# Patient Record
Sex: Female | Born: 1990 | Race: Black or African American | Hispanic: No | Marital: Single | Smoking: Never smoker
Health system: Southern US, Community
[De-identification: ages and names within clinical notes are randomized; demographics above are authoritative.]

## PROBLEM LIST (undated history)

## (undated) DIAGNOSIS — B9689 Other specified bacterial agents as the cause of diseases classified elsewhere: Secondary | ICD-10-CM

## (undated) DIAGNOSIS — N760 Acute vaginitis: Principal | ICD-10-CM

## (undated) DIAGNOSIS — B369 Superficial mycosis, unspecified: Secondary | ICD-10-CM

## (undated) HISTORY — PX: COLPOSCOPY VULVA W/ BIOPSY: SUR282

## (undated) HISTORY — PX: COLPOSCOPY: SHX161

---
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
## 2012-07-02 ENCOUNTER — Ambulatory Visit: Payer: Self-pay | Admitting: Family Medicine

## 2012-08-02 ENCOUNTER — Ambulatory Visit: Payer: Self-pay | Admitting: Internal Medicine

## 2013-03-20 ENCOUNTER — Ambulatory Visit: Payer: Self-pay

## 2013-03-20 LAB — URINALYSIS, COMPLETE
Bilirubin,UR: NEGATIVE
Blood: NEGATIVE
Glucose,UR: NEGATIVE mg/dL (ref 0–75)
Nitrite: NEGATIVE
Ph: 6 (ref 4.5–8.0)
Protein: NEGATIVE

## 2013-03-20 LAB — GC/CHLAMYDIA PROBE AMP

## 2014-11-18 ENCOUNTER — Ambulatory Visit
Admission: EM | Admit: 2014-11-18 | Discharge: 2014-11-18 | Disposition: A | Discharge status: Home / Self Care | Payer: 59 | Attending: Family Medicine | Admitting: Family Medicine

## 2014-11-18 DIAGNOSIS — L0100 Impetigo, unspecified: Secondary | ICD-10-CM | POA: Diagnosis not present

## 2014-11-18 MED ORDER — MUPIROCIN CALCIUM 2 % EX CREA: 1.0000 "application " | TOPICAL_CREAM | Freq: Two times a day (BID) | CUTANEOUS | Status: DC

## 2014-11-18 NOTE — Discharge Instructions (Signed)
MRSA Infection MRSA stands for methicillin-resistant Staphylococcus aureus. This type of infection is caused by Staphylococcus aureus bacteria that are no longer affected by the medicines used to kill them (drug resistant). Staphylococcus (staph) bacteria are normally found on the skin or in the nose of healthy people. In most cases, these bacteria do not cause infection. But if these resistant bacteria enter your body through a cut or sore, they can cause a serious infection on your skin or in other parts of your body. There is a slight chance that the staph on your skin or in your nose is MRSA. There are two types of MRSA infections:  Hospital-acquired MRSA is bacteria that you get in the hospital.  Community-acquired MRSA is bacteria that you get somewhere other than in a hospital. RISK FACTORS Hospital-acquired MRSA is more common. You could be at risk for this infection if you are in the hospital and you:  Have surgery or a procedure.  Have an IV access or a catheter tube placed in your body.  Have weak resistance to germs (weakened immune system).  Are elderly.  Are on kidney dialysis. You could be at risk for community-acquired MRSA if you have a break in your skin and come into contact with MRSA. This may happen if you:  Play sports where there is skin-to-skin contact.  Live in a crowded setting, like a dormitory or a military barracks.  Share towels, razors, or sports equipment with other people. SYMPTOMS  Symptoms of hospital-acquired MRSA depend on where MRSA has spread. Symptoms may include:  Wound infection.  Skin infection.  Rash.  Pneumonia.  Fever and chills.  Difficulty breathing.  Chest pain. Community-acquired MRSA is most likely to start as a scratch or cut that becomes infected. Symptoms may include:  A pus-filled pimple.  A boil on your skin.  Pus draining from your skin.  A sore (abscess) under your skin or somewhere in your body.  Fever  with or without chills. DIAGNOSIS  The diagnosis of MRSA is made by taking a sample from an infected area and sending it to a lab for testing. A lab technician can grow (culture) MRSA and check it under a microscope. The cultured MRSA can be tested to see which type of antibiotic medicine will work to treat it. Newer tests can identify MRSA more quickly by testing bacteria samples for MRSA genes. Your health care provider can diagnose MRSA using samples from:   Cuts or wounds in infected areas.  Nasal swabs.  Saliva or cough specimens from deep in the lungs (sputum).  Urine.  Blood. You may also have:  Imaging studies (such as X-ray or MRI) to check if the infection has spread to the lungs, bones, or joints.  A culture and sensitivity test of blood or fluids from inside the joints. TREATMENT  Treatment depends on how severe, deep, or extensive the infection is. Very bad infections may require a hospital stay.  Some skin infections, such as a small boil or sore (abscess), may be treated by draining pus from the site of the infection.  More extensive surgery to drain pus may be necessary for deeper or more widespread soft tissue infections.  You may then have to take antibiotic medicine given by mouth or through a vein. You may start antibiotic treatment right away or after testing can be done to see what antibiotic medicine should be used. HOME CARE INSTRUCTIONS   Take your antibiotics as directed by your health care provider. Take   the medicine as prescribed until it is finished.  Avoid close contact with those around you as much as possible. Do not use towels, razors, toothbrushes, bedding, or other items that will be used by others.  Wash your hands frequently for 15 seconds with soap and water. Dry your hands with a clean or disposable towel.  When you are not able to wash your hands, use hand sanitizer that is more than 60 percent alcohol.  Wash towels, sheets, or clothes in  the washing machine with detergent and hot water. Dry them in a hot dryer.  Follow your health care provider's instructions for wound care. Wash your hands before and after changing your bandages.  Always shower after exercising.  Keep all cuts and scrapes clean and covered with a bandage.  Be sure to tell all your health care providers that you have MRSA so they are aware of your infection. SEEK MEDICAL CARE IF:  You have a cut, scrape, pimple, or boil that becomes red, swollen, or painful or has pus in it.  You have pus draining from your skin.  You have an abscess under your skin or somewhere in your body. SEEK IMMEDIATE MEDICAL CARE IF:   You have symptoms of a skin infection with a fever or chills.  You have trouble breathing.  You have chest pain.  You have a skin wound and you become nauseous or start vomiting. MAKE SURE YOU:  Understand these instructions.  Will watch your condition.  Will get help right away if you are not doing well or get worse. Document Released: 06/24/2005 Document Revised: 06/29/2013 Document Reviewed: 04/16/2013 ExitCare Patient Information 2015 [employer]. This information is not intended to replace advice given to you by your health care provider. Make sure you discuss any questions you have with your health care provider.  

## 2014-11-18 NOTE — ED Provider Notes (Signed)
CSN: [id number]     Arrival date & time 11/18/14  1608 History   First MD Initiated Contact with Patient 11/18/14 1801     Chief Complaint  Patient presents with  . Rash   (Consider location/radiation/quality/duration/timing/severity/associated sxs/prior Treatment) HPI Comments: African [place] female with reoccurrence rash left shoulder and right lateral ankle.  Put on OTC antifungal cream when she noticed yesterday some purulent discharge/itching.  Daughter has sample of rash on scalp taken by PCM earlier this week results still pending.  Mother thought she had ring worm  PCM told her possible lice and to treat daughter.  Mother denied seeing nits or combing any out but child has dandruff.  Patient is a 24 y.o. female presenting with rash. The history is provided by the patient.  Rash Location:  Leg and shoulder/arm Shoulder/arm rash location:  L shoulder Leg rash location:  R ankle Severity:  Mild Onset quality:  Sudden Duration:  2 days Timing:  Constant Progression:  Worsening Chronicity:  Recurrent Context: exposure to similar rash   Context: not animal contact, not chemical exposure, not diapers, not eggs, not food, not hot tub use, not insect bite/sting, not medications, not new detergent/soap, not nuts, not plant contact, not pollen, not pregnancy, not sick contacts and not sun exposure   Relieved by:  Anti-fungal cream Worsened by:  Nothing tried Associated symptoms: no abdominal pain, no diarrhea, no fatigue, no fever, no headaches, no hoarse voice, no induration, no joint pain, no myalgias, no nausea, no periorbital edema, no shortness of breath, no sore throat, no throat swelling, no tongue swelling, no URI, not vomiting and not wheezing     History reviewed. No pertinent past medical history. History reviewed. No pertinent past surgical history. History reviewed. No pertinent family history. History  Substance Use Topics  . Smoking status: Never Smoker   . Smokeless  tobacco: Not on file  . Alcohol Use: No   OB History    No data available     Review of Systems  Constitutional: Negative for fever, chills, diaphoresis, activity change, appetite change and fatigue.  HENT: Negative for congestion, hoarse voice, mouth sores, rhinorrhea and sore throat.   Eyes: Negative for photophobia, pain, discharge, redness, itching and visual disturbance.  Respiratory: Negative for cough, shortness of breath and wheezing.   Cardiovascular: Negative for chest pain, palpitations and leg swelling.  Gastrointestinal: Negative for nausea, vomiting, abdominal pain, diarrhea and constipation.  Genitourinary: Negative for hematuria.  Musculoskeletal: Negative for myalgias, back pain, joint swelling, arthralgias, gait problem, neck pain and neck stiffness.  Skin: Positive for rash. Negative for color change, pallor and wound.  Neurological: Negative for dizziness, weakness and headaches.  Hematological: Negative for adenopathy. Does not bruise/bleed easily.  Psychiatric/Behavioral: Negative for behavioral problems, confusion, sleep disturbance and agitation.    Allergies  Review of patient's allergies indicates no known allergies.  Home Medications   Prior to Admission medications   Medication Sig Start Date End Date Taking? Authorizing Provider  mupirocin cream (BACTROBAN) 2 % Apply 1 application topically 2 (two) times daily. 11/18/14   Jarold Songina A Betancourt, NP   BP 115/71 mmHg  Pulse 86  Temp(Src) 97.5 F (36.4 C) (Tympanic)  Resp 16  Ht 5' (1.524 m)  Wt 93 lb (42.185 kg)  BMI 18.16 kg/m2  SpO2 100%  LMP 11/11/2014 (Approximate) Physical Exam  Constitutional: She is oriented to person, place, and time. Vital signs are normal. She appears well-developed and well-nourished. No distress.  HENT:  Head: Normocephalic and atraumatic.  Eyes: Conjunctivae, EOM and lids are normal. Pupils are equal, round, and reactive to light. Right eye exhibits no discharge. Left eye  exhibits no discharge. No scleral icterus.  Neck: Trachea normal and normal range of motion. Neck supple. No JVD present. No tracheal deviation present. No thyromegaly present.  Cardiovascular: Normal rate, regular rhythm, normal heart sounds and intact distal pulses.   Pulmonary/Chest: Effort normal and breath sounds normal. No respiratory distress. She has no wheezes. She has no rales. She exhibits no tenderness.  Musculoskeletal: Normal range of motion. She exhibits no edema or tenderness.  Lymphadenopathy:    She has no cervical adenopathy.  Neurological: She is alert and oriented to person, place, and time.  Skin: Skin is warm, dry and intact. Rash noted. No abrasion, no bruising, no burn, no ecchymosis, no laceration, no lesion, no petechiae and no purpura noted. Rash is maculopapular and pustular. Rash is not macular, not papular, not nodular and not vesicular. She is not diaphoretic. There is erythema. No pallor.     Patient had both lesions covered with bandaid and removed for evaluation  Psychiatric: She has a normal mood and affect. Her speech is normal and behavior is normal. Judgment and thought content normal. Cognition and memory are normal.  Nursing note and vitals reviewed.   ED Course  Procedures (including critical care time) Labs Review Labs Reviewed - No data to display  Imaging Review No results found. bactroban topical apply to affected areas twice a day for 10 days.  Do not scratch or itch.  Call or return to clinic as needed if these symptoms worsen e.g. Spreading, fever, chills or fail to improve as anticipated.  Exitcare handout on staphyloccocus infection given to patient.  Patient verbalized agreement and understanding of treatment plan.    MDM   1. Impetigo        Barbaraann Barthelina A Betancourt, NP 11/18/14 2113

## 2014-11-18 NOTE — ED Notes (Signed)
Patient complains of bump on left shoulder and one bump on left ankle. 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 Patient states that her symptoms started 2 days ago.

## 2015-04-30 ENCOUNTER — Ambulatory Visit
Admission: EM | Admit: 2015-04-30 | Discharge: 2015-04-30 | Disposition: A | Discharge status: Home / Self Care | Payer: 59 | Attending: Family Medicine | Admitting: Family Medicine

## 2015-04-30 ENCOUNTER — Encounter: Payer: Self-pay | Admitting: *Deleted

## 2015-04-30 DIAGNOSIS — N760 Acute vaginitis: Secondary | ICD-10-CM | POA: Diagnosis not present

## 2015-04-30 HISTORY — DX: Superficial mycosis, unspecified: B36.9

## 2015-04-30 LAB — CHLAMYDIA/NGC RT PCR (ARMC ONLY)
CHLAMYDIA TR: NOT DETECTED
N GONORRHOEAE: NOT DETECTED

## 2015-04-30 LAB — WET PREP, GENITAL
Trich, Wet Prep: NONE SEEN
YEAST WET PREP: NONE SEEN

## 2015-04-30 MED ORDER — METRONIDAZOLE 500 MG PO TABS: 500.0000 mg | ORAL_TABLET | Freq: Two times a day (BID) | ORAL | Status: DC

## 2015-04-30 NOTE — ED Provider Notes (Signed)
Patient presents today with symptoms of vaginal discharge. Patient states that she went to [facility] a few days ago and was treated with a gel for 5 days. She did not have resolution of her symptoms so she tried Monistat over-the-counter. Patient still has symptoms of vaginal discharge. She denies any pelvic pain, vaginal bleeding, dysuria, genital lesions, nausea, vomiting, fever. She admits to having history of STDs in the past that have been treated. Her last menstrual period was 04/12/15.   ROS: Negative except mentioned above. Vitals as per Epic.  GENERAL: NAD HEENT: no pharyngeal erythema, no exudate RESP: CTA B CARD: RRR ABD: +BS, NT GU: no genital lesions, mild white and yellow discharge, no CMT NEURO: CN II-XII grossly intact   A/P: Vaginitis-wet prep shows white blood cells, clue cells, will treat with Flagyl by mouth and wait for GC/Chlamydia results. Encourage patient on safe sex practices. If she continues to have any further symptoms are of advised her to follow-up with GYN.  Tina ProvostKirtida Maiyah Goyne, MD 04/30/15 [phone]

## 2015-04-30 NOTE — ED Notes (Signed)
Patient went to [facility] on 05/25/15 and was diagnosed with vaginitis. Patient was prescribed an antibiotic jell 5 day course that is inserted into the vagina. She is still having issues with the infection and has even tried monostat with no resolution. Patient is still having vaginal discharge.

## 2015-09-18 ENCOUNTER — Ambulatory Visit
Admission: EM | Admit: 2015-09-18 | Discharge: 2015-09-18 | Disposition: A | Discharge status: Home / Self Care | Payer: 59 | Attending: Family Medicine | Admitting: Family Medicine

## 2015-09-18 DIAGNOSIS — N760 Acute vaginitis: Secondary | ICD-10-CM | POA: Diagnosis not present

## 2015-09-18 DIAGNOSIS — A499 Bacterial infection, unspecified: Secondary | ICD-10-CM | POA: Diagnosis not present

## 2015-09-18 DIAGNOSIS — B9689 Other specified bacterial agents as the cause of diseases classified elsewhere: Secondary | ICD-10-CM

## 2015-09-18 HISTORY — DX: Acute vaginitis: N76.0

## 2015-09-18 HISTORY — DX: Other specified bacterial agents as the cause of diseases classified elsewhere: B96.89

## 2015-09-18 LAB — URINALYSIS COMPLETE WITH MICROSCOPIC (ARMC ONLY)
GLUCOSE, UA: NEGATIVE mg/dL
HGB URINE DIPSTICK: NEGATIVE
LEUKOCYTES UA: NEGATIVE
Nitrite: NEGATIVE
Protein, ur: NEGATIVE mg/dL
Specific Gravity, Urine: >1.03 — ABNORMAL HIGH (ref 1.005–1.030)
pH: 5.5 (ref 5.0–8.0)

## 2015-09-18 LAB — PREGNANCY, URINE: PREG TEST UR: NEGATIVE

## 2015-09-18 LAB — CHLAMYDIA/NGC RT PCR (ARMC ONLY)
CHLAMYDIA TR: NOT DETECTED
N GONORRHOEAE: NOT DETECTED

## 2015-09-18 LAB — WET PREP, GENITAL
Sperm: NONE SEEN
TRICH WET PREP: NONE SEEN
YEAST WET PREP: NONE SEEN

## 2015-09-18 MED ORDER — METRONIDAZOLE 500 MG PO TABS: 500.0000 mg | ORAL_TABLET | Freq: Two times a day (BID) | ORAL | Status: DC

## 2015-09-18 NOTE — ED Notes (Signed)
C/o itching and foul smelling vaginal discharge x 2 weeks. Hx of BV

## 2015-09-18 NOTE — ED Provider Notes (Signed)
[facility]  ____________________________________________  Time seen: Approximately 12:12 PM  I have reviewed the triage vital signs and the nursing notes.   HISTORY  Chief Complaint Vaginal Discharge   HPI Tina Fox is a 25 y.o. female presents with a complaint of 2 weeks of vaginal discharge. Patient reports that vaginal discharge does have an odor. States the vaginal discharge is a whitish color.  Patient does recent change in sexual partners in the last month. Denies any other recent sexual partner changes. Denies concern for sexually transmitted diseases.  Denies abnormal menstrual was, vaginal bleeding, vaginal pain, abdominal pain, dysuria, back pain, fevers, chest pain, shortness breath, dizziness or other complaints. Reports history of similar with bacterial vaginosis.  PCP: [facility]  Patient's last menstrual period was 09/10/2015 (exact date). Denies concern for pregnancy.    Past Medical History  Diagnosis Date  . Fungal dermatitis   . Bacterial vaginosis     There are no active problems to display for this patient.   Past Surgical History  Procedure Laterality Date  . Colposcopy vulva w/ biopsy      Current Outpatient Rx  Name  Route  Sig  Dispense  Refill  . metroNIDAZOLE (FLAGYL) 500 MG tablet   Oral   Take 1 tablet (500 mg total) by mouth 2 (two) times daily. For 7 days   14 tablet   0   . metroNIDAZOLE (METROGEL) 0.75 % vaginal gel   Vaginal   Place 1 Applicatorful vaginally 2 (two) times daily.         . mupirocin cream (BACTROBAN) 2 %   Topical   Apply 1 application topically 2 (two) times daily.   15 g   0     Allergies Terbinafine and related  History reviewed. No pertinent family history.  Social History Social History  Substance Use Topics  . Smoking status: Never Smoker   . Smokeless tobacco: Never Used  . Alcohol Use: 0.0 oz/week    0 Standard drinks or equivalent per week     Comment: socially     Review of Systems Constitutional: No fever/chills Eyes: No visual changes. ENT: No sore throat. Cardiovascular: Denies chest pain. Respiratory: Denies shortness of breath. Gastrointestinal: No abdominal pain.  No nausea, no vomiting.  No diarrhea.  No constipation. Genitourinary: Negative for dysuria.Positive vaginal discharge. Musculoskeletal: Negative for back pain. Skin: Negative for rash. Neurological: Negative for headaches, focal weakness or numbness.  10-point ROS otherwise negative.  ____________________________________________   PHYSICAL EXAM:  VITAL SIGNS: ED Triage Vitals  Enc Vitals Group     BP 09/18/15 1133 134/67 mmHg     Pulse Rate 09/18/15 1133 96     Resp 09/18/15 1133 16     Temp 09/18/15 1133 97.3 F (36.3 C)     Temp Source 09/18/15 1133 Tympanic     SpO2 09/18/15 1133 100 %     Weight 09/18/15 1133 95 lb (43.092 kg)     Height 09/18/15 1133 5' (1.524 m)     Head Cir --      Peak Flow --      Pain Score --      Pain Loc --      Pain Edu? --      Excl. in GC? --     Constitutional: Alert and oriented. Well appearing and in no acute distress. Eyes: Conjunctivae are normal. PERRL. EOMI. Head: Atraumatic.  Nose: No congestion/rhinnorhea.  Mouth/Throat: Mucous membranes are moist.  Oropharynx  non-erythematous.No exudate or tonsillar swelling. Neck: No stridor.  No cervical spine tenderness to palpation. Hematological/Lymphatic/Immunilogical: No cervical lymphadenopathy. Cardiovascular: Normal rate, regular rhythm. Grossly normal heart sounds.  Good peripheral circulation. Respiratory: Normal respiratory effort.  No retractions. Lungs CTAB. Gastrointestinal: Soft and nontender.  Normal Bowel sounds.  No CVA tenderness. Pelvic; completed with Beryle QuantLynne Anne RN at bedside as chaperone External: No rash or lesions, normal appearance. Speculum: Moderate whitish discharge. Cervical eyes closed. No vaginal bleeding or blood in vaginal vault. Bimanual:  Nontender. No cervical or adnexal tenderness. Musculoskeletal: No lower or upper extremity tenderness nor edema.  No cervical, thoracic or lumbar tenderness to palpation. Neurologic:  Normal speech and language. No gross focal neurologic deficits are appreciated. No gait instability. Skin:  Skin is warm, dry and intact. No rash noted. Psychiatric: Mood and affect are normal. Speech and behavior are normal.  ____________________________________________   LABS (all labs ordered are listed, but only abnormal results are displayed)  Labs Reviewed  WET PREP, GENITAL - Abnormal; Notable for the following:    Clue Cells Wet Prep HPF POC PRESENT (*)    WBC, Wet Prep HPF POC MODERATE (*)    All other components within normal limits  URINALYSIS COMPLETEWITH MICROSCOPIC ([facility] ONLY) - Abnormal; Notable for the following:    APPearance HAZY (*)    Bilirubin Urine 1+ (*)    Ketones, ur 1+ (*)    Specific Gravity, Urine >1.030 (*)    Bacteria, UA FEW (*)    Squamous Epithelial / LPF 0-5 (*)    All other components within normal limits  URINE CULTURE  CHLAMYDIA/NGC RT PCR ([facility] ONLY)  PREGNANCY, URINE    INITIAL IMPRESSION / ASSESSMENT AND PLAN / ED COURSE  Pertinent labs & imaging results that were available during my care of the patient were reviewed by me and considered in my medical decision making (see chart for details).  Very well-appearing patient. No acute distress. Presents for the complaint of vaginal discharge 2 weeks. Denies pain or other complaints. Pelvic exam completed. Urinalysis positive for few bacteria, 0-5 epithelial cells, 0-5 WBCs, hazy appearance, 1+ ketones, 1+ bilirubin, will culture urine. Wet prep positive for clue cells and moderate WBCs. Concern for a contaminated urinalysis, denies dysuria, will culture urinalysis and await culture report prior to initiating antibiotics. Treat bacterial vaginosis with oral Flagyl. Encourage PCP follow up. Encouraged pelvic  rest.   Discussed follow up with Primary care physician this week. Discussed follow up and return parameters including no resolution or any worsening concerns. Patient verbalized understanding and agreed to plan.   ____________________________________________   FINAL CLINICAL IMPRESSION(S) / ED DIAGNOSES  Final diagnoses:  Bacterial vaginosis      Note: This dictation was prepared with Dragon dictation along with smaller phrase technology. Any transcriptional errors that result from this process are unintentional.    Renford DillsLindsey Floyed Masoud, NP 09/18/15 1221

## 2015-09-18 NOTE — Discharge Instructions (Signed)
Take medication as prescribed.  Drink plenty of fluids. No sexual activity until completion of medication and follow up.   Follow up with your primary care physician this week as needed. Return to Urgent care for new or worsening concerns.    Bacterial Vaginosis Bacterial vaginosis is a vaginal infection that occurs when the normal balance of bacteria in the vagina is disrupted. It results from an overgrowth of certain bacteria. This is the most common vaginal infection in women of childbearing age. Treatment is important to prevent complications, especially in pregnant women, as it can cause a premature delivery. CAUSES  Bacterial vaginosis is caused by an increase in harmful bacteria that are normally present in smaller amounts in the vagina. Several different kinds of bacteria can cause bacterial vaginosis. However, the reason that the condition develops is not fully understood. RISK FACTORS Certain activities or behaviors can put you at an increased risk of developing bacterial vaginosis, including:  Having a new sex partner or multiple sex partners.  Douching.  Using an intrauterine device (IUD) for contraception. Women do not get bacterial vaginosis from toilet seats, bedding, swimming pools, or contact with objects around them. SIGNS AND SYMPTOMS  Some women with bacterial vaginosis have no signs or symptoms. Common symptoms include:  Grey vaginal discharge.  A fishlike odor with discharge, especially after sexual intercourse.  Itching or burning of the vagina and vulva.  Burning or pain with urination. DIAGNOSIS  Your health care provider will take a medical history and examine the vagina for signs of bacterial vaginosis. A sample of vaginal fluid may be taken. Your health care provider will look at this sample under a microscope to check for bacteria and abnormal cells. A vaginal pH test may also be done.  TREATMENT  Bacterial vaginosis may be treated with antibiotic  medicines. These may be given in the form of a pill or a vaginal cream. A second round of antibiotics may be prescribed if the condition comes back after treatment. Because bacterial vaginosis increases your risk for sexually transmitted diseases, getting treated can help reduce your risk for chlamydia, gonorrhea, HIV, and herpes. HOME CARE INSTRUCTIONS   Only take over-the-counter or prescription medicines as directed by your health care provider.  If antibiotic medicine was prescribed, take it as directed. Make sure you finish it even if you start to feel better.  Tell all sexual partners that you have a vaginal infection. They should see their health care provider and be treated if they have problems, such as a mild rash or itching.  During treatment, it is important that you follow these instructions:  Avoid sexual activity or use condoms correctly.  Do not douche.  Avoid alcohol as directed by your health care provider.  Avoid breastfeeding as directed by your health care provider. SEEK MEDICAL CARE IF:   Your symptoms are not improving after 3 days of treatment.  You have increased discharge or pain.  You have a fever. MAKE SURE YOU:   Understand these instructions.  Will watch your condition.  Will get help right away if you are not doing well or get worse. FOR MORE INFORMATION  Centers for Disease Control and Prevention, Division of STD Prevention: SolutionApps.co.zawww.cdc.gov/std American Sexual Health Association (ASHA): www.ashastd.org    This information is not intended to replace advice given to you by your health care provider. Make sure you discuss any questions you have with your health care provider.   Document Released: 06/24/2005 Document Revised: 07/15/2014 Document Reviewed: 02/03/2013  Elsevier Interactive Patient Education ©2016 [employer]vier [employer]. ° °

## 2015-09-20 LAB — URINE CULTURE
Culture: 2000
Special Requests: NORMAL

## 2016-02-05 ENCOUNTER — Ambulatory Visit
Admission: EM | Admit: 2016-02-05 | Discharge: 2016-02-05 | Disposition: A | Discharge status: Home / Self Care | Payer: 59 | Attending: Family Medicine | Admitting: Family Medicine

## 2016-02-05 DIAGNOSIS — N760 Acute vaginitis: Secondary | ICD-10-CM

## 2016-02-05 DIAGNOSIS — N898 Other specified noninflammatory disorders of vagina: Secondary | ICD-10-CM | POA: Diagnosis not present

## 2016-02-05 DIAGNOSIS — B9689 Other specified bacterial agents as the cause of diseases classified elsewhere: Secondary | ICD-10-CM

## 2016-02-05 DIAGNOSIS — A499 Bacterial infection, unspecified: Secondary | ICD-10-CM | POA: Diagnosis not present

## 2016-02-05 LAB — URINALYSIS COMPLETE WITH MICROSCOPIC (ARMC ONLY)
Bilirubin Urine: NEGATIVE
Glucose, UA: NEGATIVE mg/dL
Hgb urine dipstick: NEGATIVE
Ketones, ur: NEGATIVE mg/dL
Nitrite: NEGATIVE
PROTEIN: NEGATIVE mg/dL
RBC / HPF: NONE SEEN RBC/hpf (ref 0–5)
Specific Gravity, Urine: 1.02 (ref 1.005–1.030)
pH: 7 (ref 5.0–8.0)

## 2016-02-05 LAB — PREGNANCY, URINE: Preg Test, Ur: NEGATIVE

## 2016-02-05 LAB — WET PREP, GENITAL
Sperm: NONE SEEN
TRICH WET PREP: NONE SEEN
YEAST WET PREP: NONE SEEN

## 2016-02-05 LAB — CHLAMYDIA/NGC RT PCR (ARMC ONLY)
Chlamydia Tr: NOT DETECTED
N GONORRHOEAE: NOT DETECTED

## 2016-02-05 MED ORDER — METRONIDAZOLE 500 MG PO TABS: 500.0000 mg | ORAL_TABLET | Freq: Two times a day (BID) | ORAL | 0 refills | Status: DC

## 2016-02-05 NOTE — ED Triage Notes (Signed)
There is also odor present, and lots of pressure with urinating.

## 2016-02-05 NOTE — ED Triage Notes (Signed)
Patient complains of vaginal discharge, she states her and her boyfriend had sex a week ago and since then she has had tenderness and sharp pains along with white vaginal discharge.

## 2016-02-05 NOTE — Discharge Instructions (Signed)
Take medication as prescribed. Void post sexual intercourse. Do not douche. Drink plenty of fluids.   Follow up with your primary care physician or OBGYN this week as needed. Return to Urgent care for new or worsening concerns.

## 2016-02-05 NOTE — ED Provider Notes (Signed)
[facility] ____________________________________________  Time seen: Approximately 8:56 AM  I have reviewed the triage vital signs and the nursing notes.   HISTORY  Chief Complaint Vaginal Discharge  HPI Tina Fox is a 25 y.o. female presents with complaint of vaginal discharge. Patient reports vaginal discharge in the last few days. Patient reports history of similar in past with bacterial vaginosis. Patient reports for a few days last week she had some urinary frequency but states that that has since resolved. Also reports one day last week she had a slight and mild pelvic discomfort pain, but states that is now resolved. Patient reports mild to moderate whitish vaginal discharge. States occasional odor to the discharge. Denies vaginal itching or vaginal pain. Denies current dysuria. Denies recent change in sexual partners. Denies concerns for STDs. Denies known trigger. Denies douching.  Patient reports that she feels well otherwise. Denies any fevers, abdominal pain, nausea, vomiting, diarrhea or back pain.  Patient's last menstrual period was 01/08/2016. Denies concerns of pregnancy.   Past Medical History:  Diagnosis Date  . Bacterial vaginosis   . Fungal dermatitis     There are no active problems to display for this patient.   Past Surgical History:  Procedure Laterality Date  . COLPOSCOPY VULVA W/ BIOPSY      No current facility-administered medications for this encounter.   Current Outpatient Prescriptions:  .none  Allergies Terbinafine and related  Family history Grandmother: DM, HTN  Social History Social History  Substance Use Topics  . Smoking status: Never Smoker  . Smokeless tobacco: Never Used  . Alcohol use 0.0 oz/week     Comment: socially    Review of Systems Constitutional: No fever/chills Eyes: No visual changes. ENT: No sore throat. Cardiovascular: Denies chest pain. Respiratory: Denies shortness of  breath. Gastrointestinal: No abdominal pain.  No nausea, no vomiting.  No diarrhea.  No constipation. Genitourinary: Negative for dysuria.As above. Musculoskeletal: Negative for back pain. Skin: Negative for rash. Neurological: Negative for headaches, focal weakness or numbness.  10-point ROS otherwise negative.  ____________________________________________   PHYSICAL EXAM:  VITAL SIGNS: ED Triage Vitals [02/05/16 0831]  Enc Vitals Group     BP 107/60     Pulse Rate 84     Resp 18     Temp 97.9 F (36.6 C)     Temp Source Oral     SpO2 100 %     Weight 95 lb (43.1 kg)     Height 5' (1.524 m)     Head Circumference      Peak Flow      Pain Score 0     Pain Loc      Pain Edu?      Excl. in GC?     Constitutional: Alert and oriented. Well appearing and in no acute distress. Eyes: Conjunctivae are normal. PERRL. EOMI. ENT      Head: Normocephalic and atraumatic.      Nose: No congestion/rhinnorhea.      Mouth/Throat: Mucous membranes are moist.Oropharynx non-erythematous. Cardiovascular: Normal rate, regular rhythm. Grossly normal heart sounds.  Good peripheral circulation. Respiratory: Normal respiratory effort without tachypnea nor retractions. Breath sounds are clear and equal bilaterally. No wheezes/rales/rhonchi.. Gastrointestinal: Soft and nontender. No distention. Normal Bowel sounds. No CVA tenderness. Female: Pelvic exam completed with Jacki Cones RN at bedside as chaperone  external : Normal appearance, no rash or lesions. Speculum: moderate amount of whitish vaginal discharge, no bleeding, no foreign bodies, cervical os closed. Bimanual :  nontender. No cervical or adnexal tenderness. Musculoskeletal:  Nontender with normal range of motion in all extremities. No midline cervical, thoracic or lumbar tenderness to palpation.  Neurologic:  Normal speech and language. No gross focal neurologic deficits are appreciated. Speech is normal. No gait instability.  Skin:  Skin is  warm, dry and intact. No rash noted. Psychiatric: Mood and affect are normal. Speech and behavior are normal. Patient exhibits appropriate insight and judgment   ___________________________________________   LABS (all labs ordered are listed, but only abnormal results are displayed)  Labs Reviewed  WET PREP, GENITAL - Abnormal; Notable for the following:       Result Value   Clue Cells Wet Prep HPF POC PRESENT (*)    WBC, Wet Prep HPF POC MANY (*)    All other components within normal limits  URINALYSIS COMPLETEWITH MICROSCOPIC ([facility] ONLY) - Abnormal; Notable for the following:    APPearance CLOUDY (*)    Leukocytes, UA SMALL (*)    Bacteria, UA FEW (*)    Squamous Epithelial / LPF TOO NUMEROUS TO COUNT (*)    All other components within normal limits  CHLAMYDIA/NGC RT PCR ([facility] ONLY)  URINE CULTURE  PREGNANCY, URINE    RADIOLOGY  No results found. ____________________________________________   PROCEDURES Procedures   INITIAL IMPRESSION / ASSESSMENT AND PLAN / ED COURSE  Pertinent labs & imaging results that were available during my care of the patient were reviewed by me and considered in my medical decision making (see chart for details).  Well-appearing patient. No acute distress. Presents for the complaints of vaginal discharge for the last few days. Denies other complaints at this time. Abdomen soft and nontender. Labs reviewed. Wet prep positive clue cells, many WBCs. Urinalysis reviewed. Urinalysis with cloudy appearance, small leukocytes, few bacteria and too numerous to count squamous epithelial cells. Suspect urinalysis contamination. Will culture urinalysis prior to initiating antibiotic for UTI. Will treat bacterial vaginosis with oral Flagyl. Discussed in detail with patient regarding void post intercourse, no sexual activity until resolution of symptoms, no douching, and PCP or OB/GYN follow-up. Discussed indication, risks and benefits of medications  with patient.  Discussed follow up with Primary care physician this week. Discussed follow up and return parameters including no resolution or any worsening concerns. Patient verbalized understanding and agreed to plan.   ____________________________________________   FINAL CLINICAL IMPRESSION(S) / ED DIAGNOSES  Final diagnoses:  BV (bacterial vaginosis)  Vaginal discharge     Discharge Medication List as of 02/05/2016  9:16 AM    START taking these medications   Details   metroNIDAZOLE (FLAGYL) 500 MG tablet Take 1 tablet (500 mg total) by mouth 2 (two) times daily., Starting Mon 02/05/2016, Normal          Note: This dictation was prepared with Dragon dictation along with smaller phrase technology. Any transcriptional errors that result from this process are unintentional.    Clinical Course      Renford Dills, NP 02/05/16 8453    Renford Dills, NP 02/05/16 [phone]

## 2016-02-06 LAB — URINE CULTURE

## 2016-02-13 ENCOUNTER — Telehealth: Payer: Self-pay | Admitting: *Deleted

## 2016-02-13 NOTE — Telephone Encounter (Signed)
Called patient and asked her if here symptoms had improved. Patient reported that her symptoms are resolving and she is feeling much better. Encouraged patient to follow up with her PCP if symptoms return.

## 2016-05-31 ENCOUNTER — Encounter: Payer: Self-pay | Admitting: Medical Oncology

## 2016-05-31 ENCOUNTER — Emergency Department: Payer: 59

## 2016-05-31 ENCOUNTER — Emergency Department
Admission: EM | Admit: 2016-05-31 | Discharge: 2016-05-31 | Disposition: A | Discharge status: Home / Self Care | Payer: 59 | Attending: Emergency Medicine | Admitting: Emergency Medicine

## 2016-05-31 DIAGNOSIS — S161XXA Strain of muscle, fascia and tendon at neck level, initial encounter: Secondary | ICD-10-CM | POA: Diagnosis not present

## 2016-05-31 DIAGNOSIS — R51 Headache: Secondary | ICD-10-CM | POA: Diagnosis not present

## 2016-05-31 DIAGNOSIS — Y999 Unspecified external cause status: Secondary | ICD-10-CM | POA: Insufficient documentation

## 2016-05-31 DIAGNOSIS — Y9389 Activity, other specified: Secondary | ICD-10-CM | POA: Insufficient documentation

## 2016-05-31 DIAGNOSIS — Z79899 Other long term (current) drug therapy: Secondary | ICD-10-CM | POA: Diagnosis not present

## 2016-05-31 DIAGNOSIS — R519 Headache, unspecified: Secondary | ICD-10-CM

## 2016-05-31 DIAGNOSIS — Y92410 Unspecified street and highway as the place of occurrence of the external cause: Secondary | ICD-10-CM | POA: Diagnosis not present

## 2016-05-31 DIAGNOSIS — S199XXA Unspecified injury of neck, initial encounter: Secondary | ICD-10-CM | POA: Diagnosis present

## 2016-05-31 LAB — POCT PREGNANCY, URINE: Preg Test, Ur: NEGATIVE

## 2016-05-31 MED ORDER — NAPROXEN 500 MG PO TABS
500.0000 mg | ORAL_TABLET | Freq: Two times a day (BID) | ORAL | 0 refills | Status: DC
Start: 2016-05-31 — End: 2017-05-27

## 2016-05-31 MED ORDER — HYDROCODONE-ACETAMINOPHEN 5-325 MG PO TABS: 1.0000 | ORAL_TABLET | ORAL | 0 refills | Status: DC | PRN

## 2016-05-31 MED ORDER — HYDROCODONE-ACETAMINOPHEN 5-325 MG PO TABS
ORAL_TABLET | ORAL | Status: AC
  Administered 2016-05-31: 1 via ORAL
  Filled 2016-05-31: qty 1

## 2016-05-31 MED ORDER — METHOCARBAMOL 500 MG PO TABS: 500.0000 mg | ORAL_TABLET | Freq: Four times a day (QID) | ORAL | 0 refills | Status: DC

## 2016-05-31 MED ORDER — HYDROCODONE-ACETAMINOPHEN 5-325 MG PO TABS
1.0000 | ORAL_TABLET | Freq: Once | ORAL | Status: AC
  Administered 2016-05-31: 1 via ORAL

## 2016-05-31 NOTE — ED Notes (Signed)
Pt alert and oriented X4, active, cooperative, pt in NAD. RR even and unlabored, color WNL.  Pt informed to return if any life threatening symptoms occur.   

## 2016-05-31 NOTE — Discharge Instructions (Signed)
Follow-up with your [facility] if any continued problems. Ice or heat to your muscles as needed for discomfort. Take medication only as directed. Robaxin 500 mg 4 times a day for muscle spasms if needed, naproxen 500 mg twice a day with food for inflammation and pain. Norco if needed for severe pain every 4 hours. Do not take these medications and drive as they could cause drowsiness.
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
## 2017-05-27 ENCOUNTER — Encounter: Payer: Self-pay | Admitting: *Deleted

## 2017-05-27 ENCOUNTER — Ambulatory Visit
Admission: EM | Admit: 2017-05-27 | Discharge: 2017-05-27 | Disposition: A | Discharge status: Home / Self Care | Payer: Medicaid Other | Attending: Family Medicine | Admitting: Family Medicine

## 2017-05-27 DIAGNOSIS — Z113 Encounter for screening for infections with a predominantly sexual mode of transmission: Secondary | ICD-10-CM

## 2017-05-27 DIAGNOSIS — A549 Gonococcal infection, unspecified: Secondary | ICD-10-CM | POA: Insufficient documentation

## 2017-05-27 DIAGNOSIS — Z8619 Personal history of other infectious and parasitic diseases: Secondary | ICD-10-CM

## 2017-05-27 DIAGNOSIS — R110 Nausea: Secondary | ICD-10-CM | POA: Insufficient documentation

## 2017-05-27 DIAGNOSIS — R197 Diarrhea, unspecified: Secondary | ICD-10-CM | POA: Insufficient documentation

## 2017-05-27 DIAGNOSIS — R51 Headache: Secondary | ICD-10-CM | POA: Insufficient documentation

## 2017-05-27 LAB — URINALYSIS, COMPLETE (UACMP) WITH MICROSCOPIC
Bacteria, UA: NONE SEEN
Bilirubin Urine: NEGATIVE
Glucose, UA: NEGATIVE mg/dL
Ketones, ur: NEGATIVE mg/dL
Leukocytes, UA: NEGATIVE
Nitrite: NEGATIVE
PROTEIN: NEGATIVE mg/dL
RBC / HPF: NONE SEEN RBC/hpf (ref 0–5)
Specific Gravity, Urine: 1.02 (ref 1.005–1.030)
pH: 5.5 (ref 5.0–8.0)

## 2017-05-27 LAB — CHLAMYDIA/NGC RT PCR (ARMC ONLY)
CHLAMYDIA TR: NOT DETECTED
N gonorrhoeae: NOT DETECTED

## 2017-05-27 LAB — PREGNANCY, URINE: PREG TEST UR: NEGATIVE

## 2017-05-27 LAB — WET PREP, GENITAL
CLUE CELLS WET PREP: NONE SEEN
Sperm: NONE SEEN
Trich, Wet Prep: NONE SEEN
YEAST WET PREP: NONE SEEN

## 2017-05-27 NOTE — ED Triage Notes (Signed)
Diarrhea, headache, nausea, x3 days.

## 2017-05-27 NOTE — ED Provider Notes (Addendum)
[facility] ____________________________________________  Time seen: Approximately 0920 AM  I have reviewed the triage vital signs and the nursing notes.   HISTORY  Chief Complaint Nausea; Diarrhea; and Headache   HPI Tina Fox is a 26 y.o. female presenting for evaluation and screening of STDs.  Upon initial check-in, patient states that she has been having vomiting and diarrhea, upon interview and exam with patient, patient states that she had one episode of vomiting last week, none other.  Also reports has had occasional looser stool, but no more than 2 bowel movements per day and denies diarrhea.  Patient states that 2 weeks ago she was [facility] for vaginal discharge and was tested positive for gonorrhea.  States that she was treated with oral antibiotics as well as a shot of antibiotics at that time.  States that she has not been sexually active since then.  However states that she was not tested for other STDs and would like to have other STD testing.  States that she is no longer having vaginal discharge.  Denies vaginal or pelvic pain, abdominal pain, back pain, sore throat, fevers, rash, lesions or skin changes.  States last sexual activity was at the end of October.  Declines pregnancy.  Patient requests STD testing.  Denies urinary frequency, urinary urgency or pain with urination.  States did have some darker colored urine yesterday.  Denies history of cold sores.  Denies chest pain, shortness of breath, abdominal pain, or rash. Denies recent sickness.   Patient's last menstrual period was 05/19/2017.   Past Medical History:  Diagnosis Date  . Bacterial vaginosis   . Fungal dermatitis     There are no active problems to display for this patient.   Past Surgical History:  Procedure Laterality Date  . COLPOSCOPY VULVA W/ BIOPSY       No current facility-administered medications for this encounter.  No current outpatient  medications on file.  Allergies Terbinafine and related   family history Grandmother diabetes  Social History Social History   Tobacco Use  . Smoking status: Never Smoker  . Smokeless tobacco: Never Used  Substance Use Topics  . Alcohol use: Yes    Alcohol/week: 0.0 oz    Comment: socially  . Drug use: No    Review of Systems Constitutional: No fever/chills ENT: No sore throat. Cardiovascular: Denies chest pain. Respiratory: Denies shortness of breath. Gastrointestinal: No abdominal pain.   Genitourinary:AS above.  Musculoskeletal: Negative for back pain. Skin: Negative for rash.   ____________________________________________   PHYSICAL EXAM:  VITAL SIGNS: ED Triage Vitals  Enc Vitals Group     BP 05/27/17 0831 104/79     Pulse Rate 05/27/17 0831 (!) 105 Recheck 84     Resp 05/27/17 0831 16     Temp 05/27/17 0831 98.7 F (37.1 C)     Temp Source 05/27/17 0831 Oral     SpO2 05/27/17 0831 100 %     Weight 05/27/17 0832 105 lb (47.6 kg)     Height 05/27/17 0832 5' (1.524 m)     Head Circumference --      Peak Flow --      Pain Score --      Pain Loc --      Pain Edu? --      Excl. in GC? --     Constitutional: Alert and oriented. Well appearing and in no acute distress. Eyes: Conjunctivae are normal.  ENT  Head: Normocephalic and atraumatic.      Nose: No congestion/rhinnorhea.      Mouth/Throat: Mucous membranes are moist.Oropharynx non-erythematous. Neck: No stridor. Supple without meningismus.  Hematological/Lymphatic/Immunilogical: No cervical lymphadenopathy. Cardiovascular: Normal rate, regular rhythm. Grossly normal heart sounds.  Good peripheral circulation. Respiratory: Normal respiratory effort without tachypnea nor retractions. Breath sounds are clear and equal bilaterally. No wheezes, rales, rhonchi. Gastrointestinal: Soft and nontender.  No CVA tenderness. Declined pelvic exam. Musculoskeletal:   No midline cervical, thoracic or  lumbar tenderness to palpation.  Neurologic:  Normal speech and language. Speech is normal. No gait instability.  Skin:  Skin is warm, dry and intact. No rash noted. Psychiatric: Mood and affect are normal. Speech and behavior are normal. Patient exhibits appropriate insight and judgment   ___________________________________________   LABS (all labs ordered are listed, but only abnormal results are displayed)  Labs Reviewed  WET PREP, GENITAL - Abnormal; Notable for the following components:      Result Value   WBC, Wet Prep HPF POC MODERATE (*)    All other components within normal limits  URINALYSIS, COMPLETE (UACMP) WITH MICROSCOPIC - Abnormal; Notable for the following components:   Hgb urine dipstick TRACE (*)    Squamous Epithelial / LPF 0-5 (*)    All other components within normal limits  CHLAMYDIA/NGC RT PCR ([facility] ONLY)  PREGNANCY, URINE  RPR  HIV ANTIBODY (ROUTINE TESTING)  HSV(HERPES SIMPLEX VRS) I + II AB-IGG  HSV(HERPES SIMPLEX VRS) I + II AB-IGM  HEPATITIS PANEL, ACUTE    PROCEDURES Procedures   INITIAL IMPRESSION / ASSESSMENT AND PLAN / ED COURSE  Pertinent labs & imaging results that were available during my care of the patient were reviewed by me and considered in my medical decision making (see chart for details).  Very well-appearing patient.  No acute distress.  Patient requesting testing for STDs.  Recently treated for gonorrhea.  Patient states that she did not have full STD testing request other STD testing.  Discussed options with patient, will evaluate urine pregnancy, urinalysis, gonorrhea, chlamydia, wet prep, syphilis, HIV, herpes as well as hepatitis.  Discussed with patient self wet prep swab versus pelvic exam, patient declines rash or lesions, declines any pelvic or vaginal pain, elects for self wet prep swab.  Wet prep reviewed.  Urinalysis unremarkable.  Urine pregnancy negative.  Discussed with patient will await other results.   Counseled regarding safe sexual practices, encouraged safe sexual practices and follow-up.  Will await test results.  Discussed follow up and return parameters including no resolution or any worsening concerns. Patient verbalized understanding and agreed to plan.   ____________________________________________   FINAL CLINICAL IMPRESSION(S) / ED DIAGNOSES  Final diagnoses:  Screen for STD (sexually transmitted disease)  History of gonorrhea     ED Discharge Orders    None       Note: This dictation was prepared with Dragon dictation along with smaller phrase technology. Any transcriptional errors that result from this process are unintentional.         Renford DillsMiller, Terrica Duecker, NP 05/27/17 1045    Renford DillsMiller, Chaye Misch, NP 05/27/17 1045

## 2017-05-27 NOTE — Discharge Instructions (Signed)
Practice safe sex.  ° °Follow up with your primary care physician this week as needed. Return to Urgent care for new or worsening concerns.  ° °

## 2017-05-28 LAB — RPR: RPR: NONREACTIVE

## 2017-05-28 LAB — HEPATITIS PANEL, ACUTE
HCV Ab: <0.1 s/co ratio (ref 0.0–0.9)
HEP B C IGM: NEGATIVE
HEP B S AG: NEGATIVE
Hep A IgM: NEGATIVE

## 2017-05-28 LAB — HIV ANTIBODY (ROUTINE TESTING W REFLEX): HIV SCREEN 4TH GENERATION: NONREACTIVE

## 2017-05-28 LAB — HSV(HERPES SIMPLEX VRS) I + II AB-IGG: HSV 1 GLYCOPROTEIN G AB, IGG: 4.58 {index} — AB (ref 0.00–0.90)

## 2017-05-29 LAB — HSV(HERPES SIMPLEX VRS) I + II AB-IGM: HSVI/II Comb IgM: 1.33 Ratio — ABNORMAL HIGH (ref 0.00–0.90)

## 2017-06-02 ENCOUNTER — Telehealth: Payer: Self-pay | Admitting: *Deleted

## 2017-06-02 MED ORDER — VALACYCLOVIR HCL 1 G PO TABS: 1000.0000 mg | ORAL_TABLET | Freq: Two times a day (BID) | ORAL | 7 refills | Status: DC

## 2017-07-31 ENCOUNTER — Other Ambulatory Visit: Payer: Self-pay

## 2017-07-31 ENCOUNTER — Ambulatory Visit
Admission: EM | Admit: 2017-07-31 | Discharge: 2017-07-31 | Disposition: A | Discharge status: Home / Self Care | Payer: Medicaid Other | Attending: Emergency Medicine | Admitting: Emergency Medicine

## 2017-07-31 DIAGNOSIS — N760 Acute vaginitis: Secondary | ICD-10-CM | POA: Insufficient documentation

## 2017-07-31 DIAGNOSIS — B9689 Other specified bacterial agents as the cause of diseases classified elsewhere: Secondary | ICD-10-CM

## 2017-07-31 DIAGNOSIS — Z113 Encounter for screening for infections with a predominantly sexual mode of transmission: Secondary | ICD-10-CM

## 2017-07-31 DIAGNOSIS — N898 Other specified noninflammatory disorders of vagina: Secondary | ICD-10-CM | POA: Diagnosis present

## 2017-07-31 LAB — URINALYSIS, COMPLETE (UACMP) WITH MICROSCOPIC
Bilirubin Urine: NEGATIVE
GLUCOSE, UA: NEGATIVE mg/dL
HGB URINE DIPSTICK: NEGATIVE
Ketones, ur: NEGATIVE mg/dL
Leukocytes, UA: NEGATIVE
NITRITE: NEGATIVE
Protein, ur: NEGATIVE mg/dL
SPECIFIC GRAVITY, URINE: 1.02 (ref 1.005–1.030)
pH: 6.5 (ref 5.0–8.0)

## 2017-07-31 LAB — WET PREP, GENITAL
SPERM: NONE SEEN
Trich, Wet Prep: NONE SEEN
YEAST WET PREP: NONE SEEN

## 2017-07-31 MED ORDER — METRONIDAZOLE 500 MG PO TABS: 500.0000 mg | ORAL_TABLET | Freq: Two times a day (BID) | ORAL | 0 refills | Status: AC

## 2017-07-31 NOTE — ED Triage Notes (Signed)
Patient complains of vaginal discharge. Patient states that she recently went to health department and they told her she doesn't have BV but she feels like she does. Patient states that her symptoms have been constant since 07/15/2016

## 2017-07-31 NOTE — Discharge Instructions (Signed)
Take the medication as written. Give us a working phone number so that we can contact you if needed. Refrain from sexual contact until you know your results and your partner(s) are treated if necessary. Return to the ER if you get worse, have a fever >100.4, or for any concerns.  ° °Go to www.goodrx.com to look up your medications. This will give you a list of where you can find your prescriptions at the most affordable prices. Or ask the pharmacist what the cash price is, or if they have any other discount programs available to help make your medication more affordable. This can be less expensive than what you would pay with insurance.  ° °

## 2017-07-31 NOTE — ED Provider Notes (Signed)
HPI  SUBJECTIVE:  Tina Fox is a 27 y.o. female who presents with odorous vaginal discharge for 2 or 3 weeks.  She reports cloudy urine, and intermittent, nonradiating, nonmigratory pelvic pain lasting seconds.  There are no aggravating or alleviating factors for this pain.  She has not tried anything for this.  She reports 2 days of back pain, but this has resolved.  She denies other abdominal pain, dysuria, urgency, frequency, odorous urine, hematuria.  She had a Pap smear 10 days ago and was not thought to have BV.  She did have an abnormal Pap smear and has been referred to GYN oncology for colposcopy.  She states that she was tested for BV only was not tested for any other STI's or GYN infections at that time.  She has not been sexually active since November.  She states that her partner was female who is asymptomatic.  No genital rash, itching, vulvar pain or irritation.  No antibiotics in the past month.  No new perfumed soaps or body washes.  She tried increasing her fluids without improvement in her symptoms.  No aggravating factors.  She had gonorrhea last year, chlamydia and trichomonas 4-5 years ago.  She has a history of recurrent BV.  No history of HIV, HSV, syphilis, yeast infections.  No history of PID, diabetes, hypertension.  LMP: 1/4.  Denies possibility of being pregnant.  PMD: Cyndie Mull, DO   Past Medical History:  Diagnosis Date  . Bacterial vaginosis   . Fungal dermatitis     Past Surgical History:  Procedure Laterality Date  . COLPOSCOPY VULVA W/ BIOPSY      History reviewed. No pertinent family history.  Social History   Tobacco Use  . Smoking status: Never Smoker  . Smokeless tobacco: Never Used  Substance Use Topics  . Alcohol use: Yes    Alcohol/week: 0.0 oz    Comment: socially  . Drug use: No    No current facility-administered medications for this encounter.   Current Outpatient Medications:  .  metroNIDAZOLE (FLAGYL) 500 MG  tablet, Take 1 tablet (500 mg total) by mouth 2 (two) times daily for 7 days., Disp: 14 tablet, Rfl: 0  Allergies  Allergen Reactions  . Terbinafine And Related Hives     ROS  As noted in HPI.   Physical Exam  BP 139/83 (BP Location: Left Arm)   Pulse 91   Temp 98.6 F (37 C) (Oral)   Resp 17   Ht 5' (1.524 m)   Wt 95 lb (43.1 kg)   LMP 07/11/2016   SpO2 100%   BMI 18.55 kg/m   Constitutional: Well developed, well nourished, no acute distress Eyes:  EOMI, conjunctiva normal bilaterally HENT: Normocephalic, atraumatic,mucus membranes moist Respiratory: Normal inspiratory effort Cardiovascular: Normal rate GI: nondistended soft, nontender. No suprapubic tenderness  back: No CVA tenderness GU: External genitalia normal.  Normal vaginal mucosa.  Normal os. Scant Thin oderous white vaginal discharge.  Uterus smooth, NT. No CMT. No adnexal tenderness. No adnexal masses.  Chaperone present during exam skin: No rash, skin intact Musculoskeletal: no deformities Neurologic: Alert & oriented x 3, no focal neuro deficits Psychiatric: Speech and behavior appropriate   ED Course   Medications - No data to display  Orders Placed This Encounter  Procedures  . Pelvic exam    Standing Status:   Standing    Number of Occurrences:   1  . Chlamydia/NGC rt PCR    Standing Status:  Standing    Number of Occurrences:   1    Order Specific Question:   Patient immune status    Answer:   Normal  . Wet prep, genital    Standing Status:   Standing    Number of Occurrences:   1    Order Specific Question:   Patient immune status    Answer:   Normal  . Urinalysis, Complete w Microscopic    Standing Status:   Standing    Number of Occurrences:   1  . HIV antibody    Standing Status:   Standing    Number of Occurrences:   1  . RPR    Standing Status:   Standing    Number of Occurrences:   1    Results for orders placed or performed during the hospital encounter of 07/31/17  (from the past 24 hour(s))  Urinalysis, Complete w Microscopic     Status: Abnormal   Collection Time: 07/31/17  8:02 PM  Result Value Ref Range   Color, Urine YELLOW YELLOW   APPearance CLEAR CLEAR   Specific Gravity, Urine 1.020 1.005 - 1.030   pH 6.5 5.0 - 8.0   Glucose, UA NEGATIVE NEGATIVE mg/dL   Hgb urine dipstick NEGATIVE NEGATIVE   Bilirubin Urine NEGATIVE NEGATIVE   Ketones, ur NEGATIVE NEGATIVE mg/dL   Protein, ur NEGATIVE NEGATIVE mg/dL   Nitrite NEGATIVE NEGATIVE   Leukocytes, UA NEGATIVE NEGATIVE   Squamous Epithelial / LPF 0-5 (A) NONE SEEN   WBC, UA 0-5 0 - 5 WBC/hpf   RBC / HPF 0-5 0 - 5 RBC/hpf   Bacteria, UA RARE (A) NONE SEEN   Mucus PRESENT   Wet prep, genital     Status: Abnormal   Collection Time: 07/31/17  8:02 PM  Result Value Ref Range   Yeast Wet Prep HPF POC NONE SEEN NONE SEEN   Trich, Wet Prep NONE SEEN NONE SEEN   Clue Cells Wet Prep HPF POC PRESENT (A) NONE SEEN   WBC, Wet Prep HPF POC FEW (A) NONE SEEN   Sperm NONE SEEN    No results found.  ED Clinical Impression  BV (bacterial vaginosis)   ED Assessment/Plan  Checking gonorrhea, chlamydia, wet prep, HIV, RPR.  Discussed with the patient the option of treating her empirically for gonorrhea and chlamydia today, but she has declined.  Patient has BV.  No trichomonas, yeast.  Her urine is negative for UTI.  Will send home with flagyl. Advised pt to refrain from sexual contact until she knows lab results, symptoms resolve, and partner(s) are treated if necessary. Pt provided working phone number. Follow-up with PMD as needed.  Discussed labs, MDM, plan and followup with patient. Pt agrees with plan.   Meds ordered this encounter  Medications  . metroNIDAZOLE (FLAGYL) 500 MG tablet    Sig: Take 1 tablet (500 mg total) by mouth 2 (two) times daily for 7 days.    Dispense:  14 tablet    Refill:  0    *This clinic note was created using Scientist, clinical (histocompatibility and immunogenetics)Dragon dictation software. Therefore, there may be  occasional mistakes despite careful proofreading.  ?    Domenick GongMortenson, Tamerra Merkley, MD 07/31/17 2036

## 2017-08-01 LAB — CHLAMYDIA/NGC RT PCR (ARMC ONLY)
CHLAMYDIA TR: NOT DETECTED
N gonorrhoeae: NOT DETECTED

## 2017-08-02 LAB — HIV ANTIBODY (ROUTINE TESTING W REFLEX): HIV SCREEN 4TH GENERATION: NONREACTIVE

## 2017-08-02 LAB — RPR: RPR: NONREACTIVE

## 2017-08-27 ENCOUNTER — Ambulatory Visit
Admission: RE | Admit: 2017-08-27 | Discharge: 2017-08-27 | Disposition: A | Discharge status: Home / Self Care | Payer: Medicaid Other | Source: Ambulatory Visit | Attending: Oncology | Admitting: Oncology

## 2017-08-27 ENCOUNTER — Ambulatory Visit: Payer: Medicaid Other | Attending: Oncology

## 2017-08-27 VITALS — BP 126/78 | HR 83 | Temp 99.1°F | Ht 60.0 in | Wt 90.0 lb

## 2017-08-27 DIAGNOSIS — N63 Unspecified lump in unspecified breast: Secondary | ICD-10-CM

## 2017-08-27 NOTE — Progress Notes (Signed)
Subjective:     Patient ID: Tina BeaverUniqua Cherise Fox, female   DOB: 08/27/1990, 27 y.o.   MRN: [id number]  HPI   Review of Systems     Objective:   Physical Exam  Pulmonary/Chest: Right breast exhibits no inverted nipple, no mass, no nipple discharge, no skin change and no tenderness. Left breast exhibits tenderness. Left breast exhibits no inverted nipple, no mass, no nipple discharge and no skin change. Breasts are asymmetrical.    Left breast smaller than right       Assessment:     27 year old patient presents for BCCCP clinic visit.  Referred by Leighton RoachKarla LeathFNP at [facility] for bilateral breast nodules with associated tenderness, and for  Abnormal pap results LSIL from 07/21/17.  Patient screened, and meets BCCCP eligibility.  Patient does not have insurance, Medicare or Medicaid.  Handout given on Affordable Care Act. Instructed patient on breast self awareness using teach back method.  Patient reports tenderness has improved, but reports tenderness at 9 o'clock left breast.  Palpated a firm mobile nodule in this area.  Unable to palpate 3 o'clock right breast nodule palpated by PCP.    Plan:     Sent for bilateral breast ultrasound.  She is [facility] for LSIL pap results.

## 2017-08-29 ENCOUNTER — Encounter: Payer: Self-pay | Admitting: Obstetrics and Gynecology

## 2017-08-29 ENCOUNTER — Ambulatory Visit (INDEPENDENT_AMBULATORY_CARE_PROVIDER_SITE_OTHER): Payer: Self-pay | Admitting: Obstetrics and Gynecology

## 2017-08-29 VITALS — BP 102/62 | HR 86 | Ht 60.0 in | Wt 93.0 lb

## 2017-08-29 DIAGNOSIS — R87612 Low grade squamous intraepithelial lesion on cytologic smear of cervix (LGSIL): Secondary | ICD-10-CM

## 2017-08-29 NOTE — Progress Notes (Signed)
   GYNECOLOGY CLINIC COLPOSCOPY PROCEDURE NOTE  27 y.o. [id number] here for colposcopy for low-grade squamous intraepithelial neoplasia (LGSIL - encompassing HPV,mild dysplasia,CIN I) pap smear on 07/21/2017. Discussed underlying role for HPV infection in the development of cervical dysplasia, its natural history and progression/regression, need for surveillance.  Is the patient  pregnant: No LMP: Patient's last menstrual period was 08/05/2017 (exact date). Smoking status:  reports that  has never smoked. she has never used smokeless tobacco.  Patient given informed consent, signed copy in the chart, time out was performed.  The patient was position in dorsal lithotomy position. Speculum was placed the cervix was visualized.   After application of acetic acid colposcopic inspection of the cervix was undertaken.   Colposcopy adequate, full visualization of transformation zone: Yes no visible lesions; random 12 O'Clock biopsies obtained.   ECC specimen obtained:  Yes  All specimens were labeled and sent to pathology.   Patient was given post procedure instructions.  Will follow up pathology and manage accordingly.  Routine preventative health maintenance measures emphasized.  OBGyn Exam  Vena AustriaAndreas Breton Berns, MD, Merlinda FrederickFACOG [facility], [facility]

## 2017-09-02 ENCOUNTER — Telehealth: Payer: Self-pay

## 2017-09-02 ENCOUNTER — Encounter: Payer: Self-pay | Admitting: Obstetrics and Gynecology

## 2017-09-02 LAB — PATHOLOGY

## 2017-09-02 NOTE — Telephone Encounter (Signed)
Pt called triage line stating she had a procedure (Colpo) done on Friday. She started her cycle on Saturday and is experiencing brown colored discharge with odor. Pt described it as her "uterus is deteriorating".   Pt has no insurance but I have samples of Hylafem, can I give this to her, if so how many?

## 2017-09-02 NOTE — Telephone Encounter (Signed)
I'll call her. 

## 2017-10-06 NOTE — Progress Notes (Signed)
Breast Ultrasound results Birads 1.  Radiologist discussed with patient.  Cervical biopsy results CIN I.  Dr. Bonney AidStaebler recommends another pap smear in one year. BCCCP appointment scheduled on Wednesday September 02, 2018 at 3:00 P.M.  Dr. Bonney AidStaebler notified.  Mailed appointment information to patient.  Copy to [facility]IS.

## 2017-12-05 ENCOUNTER — Encounter: Payer: Self-pay | Admitting: Emergency Medicine

## 2017-12-05 ENCOUNTER — Ambulatory Visit
Admission: EM | Admit: 2017-12-05 | Discharge: 2017-12-05 | Disposition: A | Discharge status: Home / Self Care | Payer: Medicaid Other | Attending: Internal Medicine | Admitting: Internal Medicine

## 2017-12-05 ENCOUNTER — Other Ambulatory Visit: Payer: Self-pay

## 2017-12-05 DIAGNOSIS — N760 Acute vaginitis: Secondary | ICD-10-CM | POA: Diagnosis not present

## 2017-12-05 MED ORDER — METRONIDAZOLE 0.75 % VA GEL: 1.0000 | Freq: Every day | VAGINAL | 0 refills | Status: AC

## 2017-12-05 MED ORDER — FLUCONAZOLE 150 MG PO TABS
150.0000 mg | ORAL_TABLET | Freq: Once | ORAL | 0 refills | Status: AC
Start: 2017-12-05 — End: 2017-12-05

## 2017-12-05 NOTE — ED Triage Notes (Signed)
Patient state she may have Bacteria Vaginitis and yeast infection

## 2017-12-05 NOTE — Discharge Instructions (Addendum)
Prescriptions for metronidazole vaginal gel (BV) and for fluconazole (yeast) tablets sent to the pharmacy.  Many causes of vaginal irritation (odor, discharge, discomfort), so please recheck or followup with your PCP for further evaluation if symptoms are not improving as expected over the next several days.

## 2017-12-05 NOTE — ED Provider Notes (Signed)
[facility]    CSN: [id number] Arrival date & time: 12/05/17  1920     History   Chief Complaint Chief Complaint  Patient presents with  . Vaginitis    HPI Tina Fox is a 27 y.o. female.   She has a long history of recurrent episodes of bacterial vaginosis.  She presents today with about 4 days history of vaginal odor, no change in discharge, no dysuria.  No pelvic discomfort.  No change in her periods.  She is not sexually active and denies pregnancy.  She did a drugstore test that suggested that she might have BV and/or yeast.  She declines testing today because of symptoms that are characteristic for her, lack of alarm signs, and cost.  She agrees that further evaluation will be needed if she does not improve with treatment.  HPI  Past Medical History:  Diagnosis Date  . Bacterial vaginosis   . Fungal dermatitis      Past Surgical History:  Procedure Laterality Date  . COLPOSCOPY VULVA W/ BIOPSY      OB History    Gravida  2   Para  1   Term  1   Preterm      AB  1   Living  1     SAB      TAB  1   Ectopic      Multiple      Live Births  1            Home Medications    Prior to Admission medications   Medication Sig Start Date End Date Taking? Authorizing Provider  fluconazole (DIFLUCAN) 150 MG tablet Take 1 tablet (150 mg total) by mouth once for 1 dose. Repeat dose in 3d if needed. For positive yeast test. 12/05/17 12/05/17  Isa Rankin, MD  metroNIDAZOLE (METROGEL) 0.75 % vaginal gel Place 1 Applicatorful vaginally at bedtime for 7 days. 12/05/17 12/12/17  Isa Rankin, MD    Family History Family History  Problem Relation Age of Onset  . Cancer Neg Hx   . Diabetes Neg Hx   . Stroke Neg Hx   . Thyroid disease Neg Hx     Social History Social History   Tobacco Use  . Smoking status: Never Smoker  . Smokeless tobacco: Never Used  Substance Use Topics  . Alcohol use: Yes    Alcohol/week: 0.0  oz    Comment: socially  . Drug use: No     Allergies   Terbinafine and related   Review of Systems Review of Systems  All other systems reviewed and are negative.    Physical Exam Triage Vital Signs ED Triage Vitals  Enc Vitals Group     BP 12/05/17 1931 122/81     Pulse Rate 12/05/17 1931 93     Resp 12/05/17 1931 16     Temp 12/05/17 1931 99 F (37.2 C)     Temp Source 12/05/17 1931 Oral     SpO2 12/05/17 1931 97 %     Weight 12/05/17 1932 98 lb (44.5 kg)     Height 12/05/17 1932 5' (1.524 m)     Pain Score 12/05/17 1955 0     Pain Loc --    Updated Vital Signs BP 122/81 (BP Location: Right Arm)   Pulse 93   Temp 99 F (37.2 C) (Oral)   Resp 16   Ht 5' (1.524 m)   Wt 98 lb (44.5 kg)  LMP 11/14/2017   SpO2 97%   BMI 19.14 kg/m  Physical Exam  Constitutional: She is oriented to person, place, and time. No distress.  HENT:  Head: Atraumatic.  Eyes:  Conjugate gaze observed, no eye redness/discharge  Neck: Neck supple.  Cardiovascular: Normal rate.  Pulmonary/Chest: No respiratory distress.  Abdominal: She exhibits no distension.  Musculoskeletal: Normal range of motion.  Neurological: She is alert and oriented to person, place, and time.  Skin: Skin is warm and dry.  Nursing note and vitals reviewed.    UC Treatments / Results  Labs (all labs ordered are listed, but only abnormal results are displayed) Labs Reviewed - No data to display  EKG None  Radiology No results found.  Procedures Procedures (including critical care time)  Medications Ordered in UC Medications - No data to display  Final Clinical Impressions(s) / UC Diagnoses   Final diagnoses:  Vaginitis and vulvovaginitis     Discharge Instructions     Prescriptions for metronidazole vaginal gel (BV) and for fluconazole (yeast) tablets sent to the pharmacy.  Many causes of vaginal irritation (odor, discharge, discomfort), so please recheck or followup with your PCP for  further evaluation if symptoms are not improving as expected over the next several days.     ED Prescriptions    Medication Sig Dispense Auth. Provider   metroNIDAZOLE (METROGEL) 0.75 % vaginal gel Place 1 Applicatorful vaginally at bedtime for 7 days. 70 g Isa RankinMurray, Athanasia Stanwood Wilson, MD   fluconazole (DIFLUCAN) 150 MG tablet Take 1 tablet (150 mg total) by mouth once for 1 dose. Repeat dose in 3d if needed. For positive yeast test. 2 tablet Isa RankinMurray, Bertine Schlottman Wilson, MD       Isa RankinMurray, Kahleb Mcclane Wilson, MD 12/07/17 2159

## 2018-09-02 ENCOUNTER — Ambulatory Visit: Payer: Self-pay | Attending: Oncology

## 2018-09-02 VITALS — BP 122/81 | HR 94 | Temp 98.4°F | Ht 59.75 in | Wt 92.0 lb

## 2018-09-02 DIAGNOSIS — Z0000 Encounter for general adult medical examination without abnormal findings: Secondary | ICD-10-CM

## 2018-09-02 NOTE — Progress Notes (Signed)
  Subjective:     Patient ID: Tina Fox, female   DOB: [birth year], 28 y.o.   MRN: [id number]  HPI   Review of Systems     Objective:   Physical Exam Genitourinary:    Labia:        Right: No rash, tenderness, lesion or injury.        Left: No rash, tenderness, lesion or injury.      Cervix: Cervical bleeding present. No cervical motion tenderness, discharge, friability, lesion, erythema or eversion.     Uterus: Not deviated, not fixed, not tender and no uterine prolapse.      Adnexa:        Right: No mass, tenderness or fullness.         Left: No mass, tenderness or fullness.       Comments: Scant bleeding on pap collection       Assessment:     28 year old patient returns for 1 year follow-up pap per Dr Ramiro Harvest request.  Patient screened, and meets BCCCP eligibility.  Patient does not have insurance, Medicare or Medicaid.  Handout given on Affordable Care Act.  Reports she saw her primary provider for yeast infection in January 2020, but did not have pap.  Pelvic exam normal.   Recently completed phlebotomy program at [facility].     Plan:     Specimen collected for pap.

## 2018-09-07 LAB — PAP LB AND HPV HIGH-RISK: HPV, high-risk: POSITIVE — AB

## 2018-09-08 NOTE — Progress Notes (Unsigned)
Phoned patient with pap results.  Requested follow-up recommendation from Dr. Bonney Aid.

## 2018-09-14 NOTE — Progress Notes (Signed)
So she needs a follow up colposcopy

## 2018-09-16 NOTE — Progress Notes (Signed)
Per Dr. Ramiro Harvest request, patient is scheduled for colposcopy on 09/30/18 at 10:30.  Phoned patient with pap results, and appointment information. Will follow per BCCCP guidelines.

## 2018-09-30 ENCOUNTER — Encounter: Payer: Self-pay | Admitting: Obstetrics and Gynecology

## 2018-09-30 ENCOUNTER — Other Ambulatory Visit: Payer: Self-pay

## 2018-09-30 ENCOUNTER — Ambulatory Visit (INDEPENDENT_AMBULATORY_CARE_PROVIDER_SITE_OTHER): Payer: Self-pay | Admitting: Obstetrics and Gynecology

## 2018-09-30 ENCOUNTER — Other Ambulatory Visit (HOSPITAL_COMMUNITY)
Admission: RE | Admit: 2018-09-30 | Discharge: 2018-09-30 | Disposition: A | Discharge status: Home / Self Care | Payer: Self-pay | Source: Ambulatory Visit | Attending: Obstetrics and Gynecology | Admitting: Obstetrics and Gynecology

## 2018-09-30 VITALS — BP 118/76 | Ht 59.0 in | Wt 95.0 lb

## 2018-09-30 DIAGNOSIS — R87619 Unspecified abnormal cytological findings in specimens from cervix uteri: Secondary | ICD-10-CM

## 2018-09-30 DIAGNOSIS — N870 Mild cervical dysplasia: Secondary | ICD-10-CM

## 2018-09-30 DIAGNOSIS — R87612 Low grade squamous intraepithelial lesion on cytologic smear of cervix (LGSIL): Secondary | ICD-10-CM | POA: Insufficient documentation

## 2018-09-30 DIAGNOSIS — R87810 Cervical high risk human papillomavirus (HPV) DNA test positive: Secondary | ICD-10-CM

## 2018-09-30 NOTE — Progress Notes (Signed)
   GYNECOLOGY CLINIC COLPOSCOPY PROCEDURE NOTE  28 y.o. [id number] here for colposcopy for LSIL HPV positive  pap smear on 09/02/2018. Discussed underlying role for HPV infection in the development of cervical dysplasia, its natural history and progression/regression, need for surveillance.  Pap History: 09/02/2018 LGSIL HPV positive 08/29/2017 Colposcopy CIN I 12 O'Clock and ECC negative 07/31/2017 LGSIL   Is the patient  pregnant: No LMP: Patient's last menstrual period was 09/16/2018. Smoking status:  reports that she has never smoked. She has never used smokeless tobacco. Contraception: abstinence Number current sexual partners:  0 Future fertility desired:  Yes  Patient given informed consent, signed copy in the chart, time out was performed.  The patient was position in dorsal lithotomy position. Speculum was placed the cervix was visualized.   After application of acetic acid colposcopic inspection of the cervix was undertaken.   Colposcopy adequate, full visualization of transformation zone: Yes acetowhite lesion(s) noted at 1 o'clock; corresponding biopsies obtained.   ECC specimen obtained:  Yes  All specimens were labeled and sent to pathology.   Patient was given post procedure instructions.  Will follow up pathology and manage accordingly.  Routine preventative health maintenance measures emphasized.  OBGyn Exam       Vena Austria, MD, [facility], [facility]

## 2018-11-25 NOTE — Progress Notes (Signed)
Dr. Bonney Aid notified patient of cervical biopsy results, and recommended 1 year follow-up pap.  Patient is scheduled for BCCCP pap on 09/08/2019 at 2:00.  Mailed reminder. Copy to [facility]IS.

## 2019-05-07 ENCOUNTER — Ambulatory Visit
Admission: RE | Admit: 2019-05-07 | Discharge: 2019-05-07 | Disposition: A | Discharge status: Home / Self Care | Payer: Self-pay | Source: Ambulatory Visit | Attending: Family Medicine | Admitting: Family Medicine

## 2019-05-07 ENCOUNTER — Other Ambulatory Visit: Payer: Self-pay | Admitting: Family Medicine

## 2019-05-07 DIAGNOSIS — R9389 Abnormal findings on diagnostic imaging of other specified body structures: Secondary | ICD-10-CM

## 2019-09-06 ENCOUNTER — Other Ambulatory Visit: Payer: Self-pay

## 2019-09-08 ENCOUNTER — Other Ambulatory Visit: Payer: Self-pay

## 2019-09-08 ENCOUNTER — Ambulatory Visit: Payer: Self-pay | Attending: Oncology

## 2019-09-08 VITALS — BP 127/78 | HR 92 | Temp 98.0°F | Ht 61.0 in | Wt 94.6 lb

## 2019-09-08 DIAGNOSIS — Z0000 Encounter for general adult medical examination without abnormal findings: Secondary | ICD-10-CM

## 2019-09-08 DIAGNOSIS — R87612 Low grade squamous intraepithelial lesion on cytologic smear of cervix (LGSIL): Secondary | ICD-10-CM

## 2019-09-08 NOTE — Progress Notes (Signed)
  Subjective:     Patient ID: Tina Fox, female   DOB: September 21, 1990, 29 y.o.   MRN: [id number]  HPI   Review of Systems     Objective:   Physical Exam Genitourinary:    Labia:        Right: No rash, tenderness, lesion or injury.        Left: No rash, tenderness, lesion or injury.      Cervix: Friability and erythema present. No cervical motion tenderness, discharge, lesion, cervical bleeding or eversion.        Comments: Bleeding from os on pap collection       Assessment:     29 year old patient being followed for abnormal paps/ CINI biopsy with Dr. Bonney Aid.  Thin white no-odorous cervical discharge noted. Redness noted superior to os.  Scant bleeding on Pap specimen collection.    Plan:     Specimen collected for pap.  Will follow BCCCP/Dr. Staebler recommendation.

## 2019-09-13 LAB — IGP, APTIMA HPV: HPV Aptima: POSITIVE — AB

## 2019-09-20 NOTE — Progress Notes (Signed)
Left message for patient to return call regarding pap results.  Virtual visit with Dr. Bonney Aid 10/01/19 with recommendation for colposcopy in April 2021.  Primary provider at [facility] also recommended BCCCP follow-up colposcopy.  Patient not responding to recommendations.  Copy to [facility]IS.

## 2019-09-22 ENCOUNTER — Telehealth: Payer: Self-pay | Admitting: Obstetrics and Gynecology

## 2019-09-22 NOTE — Telephone Encounter (Signed)
Patient is schedule for 10/01/22 for consult to speak with Dr. Bonney Aid before scheduling Colpo

## 2019-09-22 NOTE — Telephone Encounter (Signed)
Correction 10/01/19*

## 2019-09-22 NOTE — Telephone Encounter (Signed)
Thank you :)

## 2019-09-22 NOTE — Telephone Encounter (Signed)
-----   Message from Lewis Moccasin sent at 09/21/2019  2:05 PM EDT ----- Regarding: [name], please schedule the patient for a consult w/ Dr. Bonney Aid. Thanks. ----- Message ----- From: Scarlett Presto, RN Sent: 09/21/2019  10:19 AM EDT To: Vena Austria, MD, Lewis Moccasin  Patient would like a consult to discuss options.  She has had the same results for a while.

## 2019-09-30 ENCOUNTER — Telehealth: Payer: Self-pay | Admitting: Obstetrics and Gynecology

## 2019-09-30 NOTE — Telephone Encounter (Signed)
Patient is call to find out if her consult schedule for 10/01/19 at 3:10 to over over abnormal pap result and be via phone or Vitual. Please advise

## 2019-09-30 NOTE — Telephone Encounter (Signed)
Correction:Patient is call to find out if her consult schedule for 10/01/19 at 3:10 to go over abnormal pap result and if it could be via phone or Vitual. Please advise

## 2019-10-01 ENCOUNTER — Telehealth (INDEPENDENT_AMBULATORY_CARE_PROVIDER_SITE_OTHER): Payer: Self-pay | Admitting: Obstetrics and Gynecology

## 2019-10-01 ENCOUNTER — Encounter: Payer: Self-pay | Admitting: Obstetrics and Gynecology

## 2019-10-01 ENCOUNTER — Other Ambulatory Visit: Payer: Self-pay

## 2019-10-01 DIAGNOSIS — R87619 Unspecified abnormal cytological findings in specimens from cervix uteri: Secondary | ICD-10-CM

## 2019-10-01 DIAGNOSIS — B977 Papillomavirus as the cause of diseases classified elsewhere: Secondary | ICD-10-CM

## 2019-10-01 DIAGNOSIS — R87612 Low grade squamous intraepithelial lesion on cytologic smear of cervix (LGSIL): Secondary | ICD-10-CM

## 2019-10-01 DIAGNOSIS — N72 Inflammatory disease of cervix uteri: Secondary | ICD-10-CM

## 2019-10-01 NOTE — Telephone Encounter (Signed)
Patient is schedule for virtual appointment

## 2019-10-01 NOTE — Telephone Encounter (Signed)
No she needs a colposcopy.  If she wants to do a virtual consult she can but the downside is she'll have to make time for the colposcopy appointment in person following the virtual visit

## 2019-10-01 NOTE — Progress Notes (Signed)
I connected with Tina Fox on 10/04/19 at  3:10 PM EDT by telephone and verified that I am speaking with the correct person using two identifiers.   I discussed the limitations, risks, security and privacy concerns of performing an evaluation and management service by telephone and the availability of in person appointments. I also discussed with the patient that there may be a patient responsible charge related to this service. The patient expressed understanding and agreed to proceed.  The patient was at home I spoke with the patient from my workstation phone The names of people involved in this encounter were: Tina Fox , and Tina Fox Gynecology Office Visit   Chief Complaint:  Chief Complaint  Patient presents with  . Advice Only    Discuss Abnormal pap smear through [facility] program    History of Present Illness:Tina Fox is a 29 y.o. woman who presents today for continued surveillance for history of dysplasia. Last pap obtained on 09/08/2019 revealed LGSIL HPV pap.  Prior colposcopy CIN I    Pap/Treatment History:  09/08/2019 LGSIL HPV positive 09/30/2018 Colposcopy CIN I O'Clock negative ECC 09/02/2018 LGSIL HPV positive 08/29/2017 Colposcopy CIN I 12 O'Clock and ECC negative 07/31/2017 LGSIL   Review of Systems: negative unless noted in HPI  Past Medical History:  Patient Active Problem List   Diagnosis Date Noted  . Abnormal Pap smear of cervix 09/30/2018    09/08/2019 LGSIL HPV positive 09/30/2018 Colposcopy CIN I O'Clock negative ECC 09/02/2018 LGSIL HPV positive 08/29/2017 Colposcopy CIN I 12 O'Clock and ECC negative 07/31/2017 LGSIL      Past Surgical History:  Patient Active Problem List   Diagnosis Date Noted  . Abnormal Pap smear of cervix 09/30/2018    09/08/2019 LGSIL HPV positive 09/30/2018 Colposcopy CIN I O'Clock negative ECC 09/02/2018 LGSIL HPV positive 08/29/2017 Colposcopy CIN I 12 O'Clock and ECC  negative 07/31/2017 LGSIL      Gynecologic History: Patient's last menstrual period was 09/16/2019.  Obstetric History: G2P1011  Family History:  Family History  Problem Relation Age of Onset  . Cancer Neg Hx   . Diabetes Neg Hx   . Stroke Neg Hx   . Thyroid disease Neg Hx     Social History:  Social History   Socioeconomic History  . Marital status: Single    Spouse name: Not on file  . Number of children: Not on file  . Years of education: Not on file  . Highest education level: Not on file  Occupational History  . Not on file  Tobacco Use  . Smoking status: Never Smoker  . Smokeless tobacco: Never Used  Substance and Sexual Activity  . Alcohol use: Yes    Alcohol/week: 0.0 standard drinks    Comment: socially  . Drug use: No  . Sexual activity: Not Currently    Birth control/protection: None  Other Topics Concern  . Not on file  Social History Narrative  . Not on file   Social Determinants of Health   Financial Resource Strain:   . Difficulty of Paying Living Expenses:   Food Insecurity:   . Worried About Charity fundraiser in the Last Year:   . Arboriculturist in the Last Year:   Transportation Needs:   . Film/video editor (Medical):   Marland Kitchen Lack of Transportation (Non-Medical):   Physical Activity:   . Days of Exercise per Week:   . Minutes of Exercise per  Session:   Stress:   . Feeling of Stress :   Social Connections:   . Frequency of Communication with Friends and Family:   . Frequency of Social Gatherings with Friends and Family:   . Attends Religious Services:   . Active Member of Clubs or Organizations:   . Attends Banker Meetings:   Marland Kitchen Marital Status:   Intimate Partner Violence:   . Fear of Current or Ex-Partner:   . Emotionally Abused:   Marland Kitchen Physically Abused:   . Sexually Abused:     Allergies:  Allergies  Allergen Reactions  . Terbinafine And Related Hives    Medications: Prior to Admission medications   Not  on File    Physical Exam Vitals: There were no vitals filed for this visit. Patient's last menstrual period was 09/16/2019.  No physical exam as this was a remote telephone visit to promote social distancing during the current COVID-19 Pandemic  Assessment: 29 y.o. G2P1011 follow up for LGSIL pap  Plan: Problem List Items Addressed This Visit      Other   Abnormal Pap smear of cervix    Other Visit Diagnoses    LGSIL of cervix of undetermined significance    -  Primary   High risk human papilloma virus (HPV) infection of cervix           - I had a lengthly discussion with Bayard Beaver  regarding the cause of dysplasia of the lower genital tract (including immunosuppression in the setting of HPV exposure and tobacco exposure). I explained the potential for progression to invasive malignancy, the recurrent nature of these lesions (and the need for close continued followup). Results of today's pap will dictate need for further evaluation and follow up per ASCCP guidelines..  - She is comfortable with the plan and had her questions answered.  - Telephone time 6:53 min  - Return in about 3 weeks (around 10/22/2019) for 3-6 weeks colposcopy.   Vena Austria, MD, [facility], [facility] 10/04/2019, 1:22 PM

## 2020-01-11 ENCOUNTER — Encounter: Payer: Self-pay | Admitting: Physician Assistant

## 2020-01-11 NOTE — Progress Notes (Signed)
Reviewed patient pap colpo information from 2020 and 2021.  Thru [facility] patient has been followed for LSIL/HPV + in 08/2018, colpo in 09/2018 was LSIL/CIN 1.  Patient also had pap 09/08/2019 that was LSIL/HPV +.  Plan was for her to have colpo 10/2019.  Would recommend that patient continue to follow through [facility] until released.

## 2020-02-29 NOTE — Progress Notes (Signed)
Left message for patient to call back to schedule follow-up with Dr. Bonney Aid for colposcopy as recommended for April 2021.

## 2020-03-27 ENCOUNTER — Ambulatory Visit (INDEPENDENT_AMBULATORY_CARE_PROVIDER_SITE_OTHER): Payer: Self-pay | Admitting: Obstetrics and Gynecology

## 2020-03-27 ENCOUNTER — Other Ambulatory Visit: Payer: Self-pay

## 2020-03-27 ENCOUNTER — Encounter: Payer: Self-pay | Admitting: Obstetrics and Gynecology

## 2020-03-27 VITALS — BP 128/66 | HR 80 | Ht 59.0 in | Wt 97.0 lb

## 2020-03-27 DIAGNOSIS — R87612 Low grade squamous intraepithelial lesion on cytologic smear of cervix (LGSIL): Secondary | ICD-10-CM

## 2020-03-27 NOTE — Progress Notes (Signed)
   GYNECOLOGY CLINIC COLPOSCOPY PROCEDURE NOTE  29 y.o. [id number] here for colposcopy for low-grade squamous intraepithelial neoplasia (LGSIL - encompassing HPV,mild dysplasia,CIN I)  pap smear on 09/08/2019. Discussed underlying role for HPV infection in the development of cervical dysplasia, its natural history and progression/regression, need for surveillance.  Pap History 09/08/2019 LGSIL HPV positive 09/30/2018 Colposcopy CIN I O'Clock negative ECC 09/02/2018 LGSIL HPV positive 08/29/2017 Colposcopy CIN I 12 O'Clock and ECC negative 07/31/2017 LGSIL   Is the patient  pregnant: No LMP: Patient's last menstrual period was 03/20/2020. Smoking status:  reports that she has never smoked. She has never used smokeless tobacco.  Patient given informed consent, signed copy in the chart, time out was performed.  The patient was position in dorsal lithotomy position. Speculum was placed the cervix was visualized.   After application of acetic acid colposcopic inspection of the cervix was undertaken.   Colposcopy adequate, full visualization of transformation zone: Yes acetowhite lesion(s) noted at 12 o'clock; corresponding biopsies obtained.   ECC specimen obtained:  Yes  All specimens were labeled and sent to pathology.   Patient was given post procedure instructions.  Will follow up pathology and manage accordingly.  Routine preventative health maintenance measures emphasized.  OBGyn Exam  Vena Austria, MD, [facility], [facility]

## 2020-03-28 ENCOUNTER — Other Ambulatory Visit: Payer: Self-pay | Admitting: Obstetrics and Gynecology

## 2020-03-28 DIAGNOSIS — R102 Pelvic and perineal pain: Secondary | ICD-10-CM

## 2020-03-28 LAB — ANATOMIC PATHOLOGY REPORT

## 2020-03-28 NOTE — Progress Notes (Signed)
TVUS for pelvic pain in the next 1-2 week order for ultrasound is in

## 2020-03-28 NOTE — Progress Notes (Signed)
Pelvic pain, intermittent, not related to menstrual cycle, no dyspareunia.  Last cycle did have some pain during actual cycle.  Will set up for TVUS to evalute

## 2020-03-29 NOTE — Progress Notes (Signed)
Per Dr. Ramiro Harvest note, patient is to return in one year for follow-up.  She is scheduled for TVUS in his office on 04/13/20 for pelvic pain.  Copy to [facility].

## 2020-04-13 ENCOUNTER — Other Ambulatory Visit: Payer: Self-pay

## 2020-04-13 ENCOUNTER — Ambulatory Visit (INDEPENDENT_AMBULATORY_CARE_PROVIDER_SITE_OTHER): Payer: Managed Care, Other (non HMO) | Admitting: Obstetrics and Gynecology

## 2020-04-13 ENCOUNTER — Encounter: Payer: Self-pay | Admitting: Obstetrics and Gynecology

## 2020-04-13 ENCOUNTER — Ambulatory Visit (INDEPENDENT_AMBULATORY_CARE_PROVIDER_SITE_OTHER): Payer: Managed Care, Other (non HMO)

## 2020-04-13 VITALS — BP 124/72 | Wt 93.0 lb

## 2020-04-13 DIAGNOSIS — R102 Pelvic and perineal pain: Secondary | ICD-10-CM

## 2020-04-13 MED ORDER — NORETHINDRONE ACETATE 5 MG PO TABS: 5.0000 mg | ORAL_TABLET | Freq: Every day | ORAL | 11 refills | Status: DC

## 2020-04-13 NOTE — Progress Notes (Signed)
Gynecology Ultrasound Follow Up  Chief Complaint:  Chief Complaint  Patient presents with  . Follow-up    GYN ultrasound     History of Present Illness: Patient is a 29 y.o. female who presents today for ultrasound evaluation of pelvic pain.  Ultrasound demonstrates the following findgins Adnexa: no masses seen Uterus: Non-enlarged with endometrial stripe normal Additional: no free fluid  Review of Systems: Review of Systems  Constitutional: Negative.   Gastrointestinal: Positive for abdominal pain. Negative for blood in stool, constipation, diarrhea, heartburn, melena, nausea and vomiting.  Genitourinary: Negative.     Past Medical History:  Past Medical History:  Diagnosis Date  . Bacterial vaginosis   . Fungal dermatitis     Past Surgical History:  Past Surgical History:  Procedure Laterality Date  . COLPOSCOPY    . COLPOSCOPY VULVA W/ BIOPSY      Gynecologic History:  Patient's last menstrual period was 03/20/2020. Last Pap: 09/08/2019 LGSIL HPV positive, 03/27/2020 colposcopy CIN I  Family History:  Family History  Problem Relation Age of Onset  . Cancer Neg Hx   . Diabetes Neg Hx   . Stroke Neg Hx   . Thyroid disease Neg Hx     Social History:  Social History   Socioeconomic History  . Marital status: Single    Spouse name: Not on file  . Number of children: Not on file  . Years of education: Not on file  . Highest education level: Not on file  Occupational History  . Not on file  Tobacco Use  . Smoking status: Never Smoker  . Smokeless tobacco: Never Used  Vaping Use  . Vaping Use: Never used  Substance and Sexual Activity  . Alcohol use: Yes    Alcohol/week: 0.0 standard drinks    Comment: socially  . Drug use: No  . Sexual activity: Not Currently    Birth control/protection: None  Other Topics Concern  . Not on file  Social History Narrative  . Not on file   Social Determinants of Health   Financial Resource Strain:   .  Difficulty of Paying Living Expenses: Not on file  Food Insecurity:   . Worried About Programme researcher, broadcasting/film/video in the Last Year: Not on file  . Ran Out of Food in the Last Year: Not on file  Transportation Needs:   . Lack of Transportation (Medical): Not on file  . Lack of Transportation (Non-Medical): Not on file  Physical Activity:   . Days of Exercise per Week: Not on file  . Minutes of Exercise per Session: Not on file  Stress:   . Feeling of Stress : Not on file  Social Connections:   . Frequency of Communication with Friends and Family: Not on file  . Frequency of Social Gatherings with Friends and Family: Not on file  . Attends Religious Services: Not on file  . Active Member of Clubs or Organizations: Not on file  . Attends Banker Meetings: Not on file  . Marital Status: Not on file  Intimate Partner Violence:   . Fear of Current or Ex-Partner: Not on file  . Emotionally Abused: Not on file  . Physically Abused: Not on file  . Sexually Abused: Not on file    Allergies:  Allergies  Allergen Reactions  . Terbinafine And Related Hives    Medications: Prior to Admission medications   Medication Sig Start Date End Date Taking? Authorizing Provider  norethindrone (AYGESTIN) 5 MG  tablet Take 1 tablet (5 mg total) by mouth daily. 04/13/20   Vena Austria, MD    Physical Exam Vitals: Blood pressure 124/72, weight 93 lb (42.2 kg), last menstrual period 03/20/2020.  General: NAD HEENT: normocephalic, anicteric Pulmonary: No increased work of breathing Extremities: no edema, erythema, or tenderness Neurologic: Grossly intact, normal gait Psychiatric: mood appropriate, affect full  US Transvaginal Non-OB  Result Date: 04/13/2020 Patient Name: Tina Fox DOB: 1991/03/05 MRN: [id number] ULTRASOUND REPORT Location: [facility] Date of Service: 04/13/2020 Indications:Pelvic Pain Findings: The uterus is retroverted and measures 8.1 x 5.4 x 3.6 cm. Echo  texture is homogenous without evidence of focal masses. The Endometrium measures 4.6 mm. Right Ovary measures 2.6 x 2.4 x 1.3 cm. It is normal in appearance. Left Ovary measures 2.9 x 2.5 x 1.7 cm. It is normal in appearance. Survey of the adnexa demonstrates no adnexal masses. There is no free fluid in the cul de sac. Impression: 1. Normal pelvic ultrasound. Recommendations: 1.Clinical correlation with the patient's History and Physical Exam. Deanna Artis, RT Images reviewed.  Normal GYN study without visualized pathology.  Vena Austria, MD, [facility], [facility] 04/13/2020, 9:52 AM    Assessment: 29 y.o. G2P1011   Plan: Problem List Items Addressed This Visit    None    Visit Diagnoses    Pelvic pain    -  Primary      1) Pelvic pain - normal imaging of gyn structures on ultrasound today. Discussed possibility of endometriosis as underlying etiology.  Treatment may be empiric vs proceeding with diagnostic laparoscopy.  Patient opts for empiric treatment with norethindrone at this time.  2) A total of 15 minutes were spent in face-to-face contact with the patient during this encounter with over half of that time devoted to counseling and coordination of care.  3) Return in about 3 months (around 07/14/2020) for medication follow .    Vena Austria, MD, [facility], [facility] 04/13/2020, 10:08 AM

## 2020-07-12 ENCOUNTER — Ambulatory Visit: Payer: Managed Care, Other (non HMO) | Admitting: Obstetrics and Gynecology

## 2020-11-01 ENCOUNTER — Encounter: Payer: Self-pay | Admitting: Obstetrics and Gynecology

## 2020-11-01 ENCOUNTER — Other Ambulatory Visit: Payer: Self-pay | Admitting: Obstetrics and Gynecology

## 2020-11-01 ENCOUNTER — Other Ambulatory Visit: Payer: Self-pay

## 2020-11-01 ENCOUNTER — Ambulatory Visit (INDEPENDENT_AMBULATORY_CARE_PROVIDER_SITE_OTHER): Payer: No Typology Code available for payment source | Admitting: Obstetrics and Gynecology

## 2020-11-01 VITALS — BP 110/58 | Ht 59.0 in | Wt 94.0 lb

## 2020-11-01 DIAGNOSIS — F520 Hypoactive sexual desire disorder: Secondary | ICD-10-CM | POA: Diagnosis not present

## 2020-11-01 MED ORDER — ADDYI 100 MG PO TABS: 100.0000 mg | ORAL_TABLET | Freq: Every day | ORAL | 3 refills | Status: DC

## 2020-11-01 MED ORDER — ADDYI 100 MG PO TABS: 100.0000 mg | ORAL_TABLET | Freq: Every day | ORAL | 3 refills | Status: AC

## 2020-11-01 NOTE — Progress Notes (Signed)
Obstetrics & Gynecology Office Visit   Chief Complaint:  Chief Complaint  Patient presents with  . Sexual Problem    Low libido - has no interest at all.  RM 4    History of Present Illness: 30 y.o. G2P1011 presenting to discuss management option for low libido.  The patient states she lacks any real sexual desire.  There are no acute stressors in her life currently.  Her relationship with her significant other is not strained.  She denies body self image issues.    Has not started norethindrone so currently not on any exogenous hormones.  Patient's last menstrual period was 10/12/2020.  Ultrasound for pelvic pain 04/2020 normal.  Norethindrone trial suggested to cover for possible endometriosis. D   Review of Systems: Review of Systems  Constitutional: Negative.   Gastrointestinal: Negative.   Genitourinary: Negative.      Past Medical History:  Past Medical History:  Diagnosis Date  . Bacterial vaginosis   . Fungal dermatitis     Past Surgical History:  Past Surgical History:  Procedure Laterality Date  . COLPOSCOPY    . COLPOSCOPY VULVA W/ BIOPSY      Gynecologic History: Patient's last menstrual period was 10/12/2020.  Obstetric History: G2P1011  Family History:  Family History  Problem Relation Age of Onset  . Cancer Neg Hx   . Diabetes Neg Hx   . Stroke Neg Hx   . Thyroid disease Neg Hx     Social History:  Social History   Socioeconomic History  . Marital status: Single    Spouse name: Not on file  . Number of children: Not on file  . Years of education: Not on file  . Highest education level: Not on file  Occupational History  . Not on file  Tobacco Use  . Smoking status: Never Smoker  . Smokeless tobacco: Never Used  Vaping Use  . Vaping Use: Never used  Substance and Sexual Activity  . Alcohol use: Yes    Alcohol/week: 0.0 standard drinks    Comment: socially  . Drug use: No  . Sexual activity: Not Currently    Birth  control/protection: None  Other Topics Concern  . Not on file  Social History Narrative  . Not on file   Social Determinants of Health   Financial Resource Strain: Not on file  Food Insecurity: Not on file  Transportation Needs: Not on file  Physical Activity: Not on file  Stress: Not on file  Social Connections: Not on file  Intimate Partner Violence: Not on file    Allergies:  Allergies  Allergen Reactions  . Terbinafine And Related Hives    Medications: Prior to Admission medications   Medication Sig Start Date End Date Taking? Authorizing Provider  Flibanserin (ADDYI) 100 MG TABS Take 100 mg by mouth daily. 11/01/20   Vena Austria, MD  norethindrone (AYGESTIN) 5 MG tablet Take 1 tablet (5 mg total) by mouth daily. Patient not taking: Reported on 11/01/2020 04/13/20   Vena Austria, MD    Physical Exam Vitals:  Vitals:   11/01/20 1135  BP: (!) 110/58   Patient's last menstrual period was 10/12/2020.  General: NAD HEENT: normocephalic, anicteric Pulmonary: No increased work of breathing Extremities: no edema, erythema, or tenderness Neurologic: Grossly intact Psychiatric: mood appropriate, affect full  Female chaperone present for pelvic  portions of the physical exam  Assessment: 30 y.o. [id number] with low libido  Plan: Problem List Items Addressed This Visit  None   Visit Diagnoses    Hypoactive sexual desire disorder    -  Primary     1) HSDD - Discussed option for low labido currently FDA approved include Vylessi and Addyi.  Discussed dosing side-effect profile of both. Provided with handouts on both.  Difficulty in treatment hinges on the fact that etiologies are often multifaceted.  Generally this is not done to an underlying hormonal imbalance.  - Patient opts for trial of addyi.  Stressed interaction with EtOH.    2) Cervical dysplasia - next pap to be done 03/2021  3) A total of 15 minutes were spent in face-to-face contact with the  patient during this encounter with over half of that time devoted to counseling and coordination of care.  3) Return in about 6 years (around 11/02/2026) for 6-8 week medication follow up.     Vena Austria, MD, [facility], [facility] 11/01/2020, 12:00 PM

## 2020-11-13 ENCOUNTER — Telehealth: Payer: Self-pay

## 2020-11-13 NOTE — Telephone Encounter (Signed)
Tina Fox from [facility] calling to confirm pt information; pt changed her name in their system as 'Tina Fox'; wants to know if we can change the name on her rx.  [phone]

## 2020-11-13 NOTE — Telephone Encounter (Signed)
Spoke to [place] and confirmed pt name in our record.

## 2020-12-26 ENCOUNTER — Ambulatory Visit: Payer: No Typology Code available for payment source | Admitting: Obstetrics and Gynecology

## 2021-04-04 IMAGING — CR DG CHEST 2V
2 series · 2 of 2 positions shown · non-contrast
Comparison: May 31, 2016

CLINICAL DATA: Reported previous abnormal chest radiograph

EXAM:
CHEST - 2 VIEW

[chest pa]
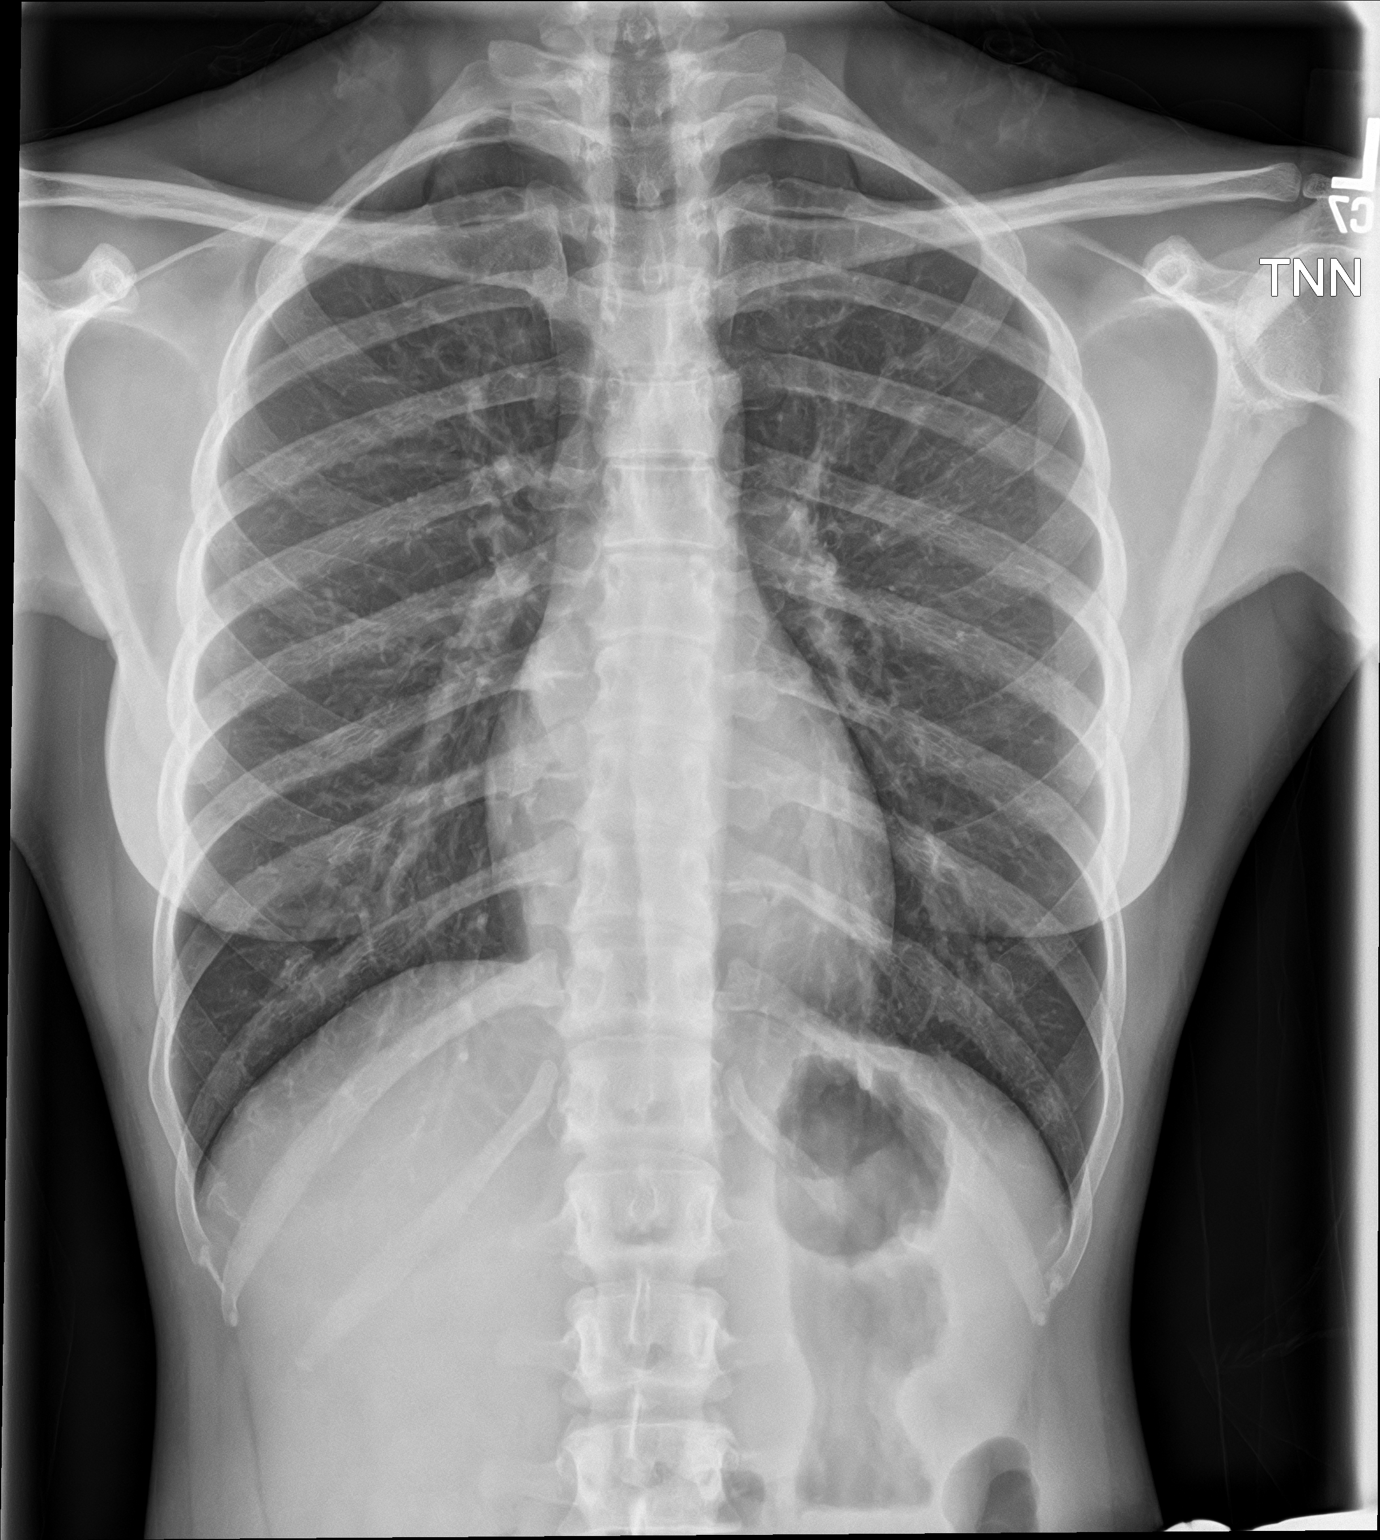

[chest lat]
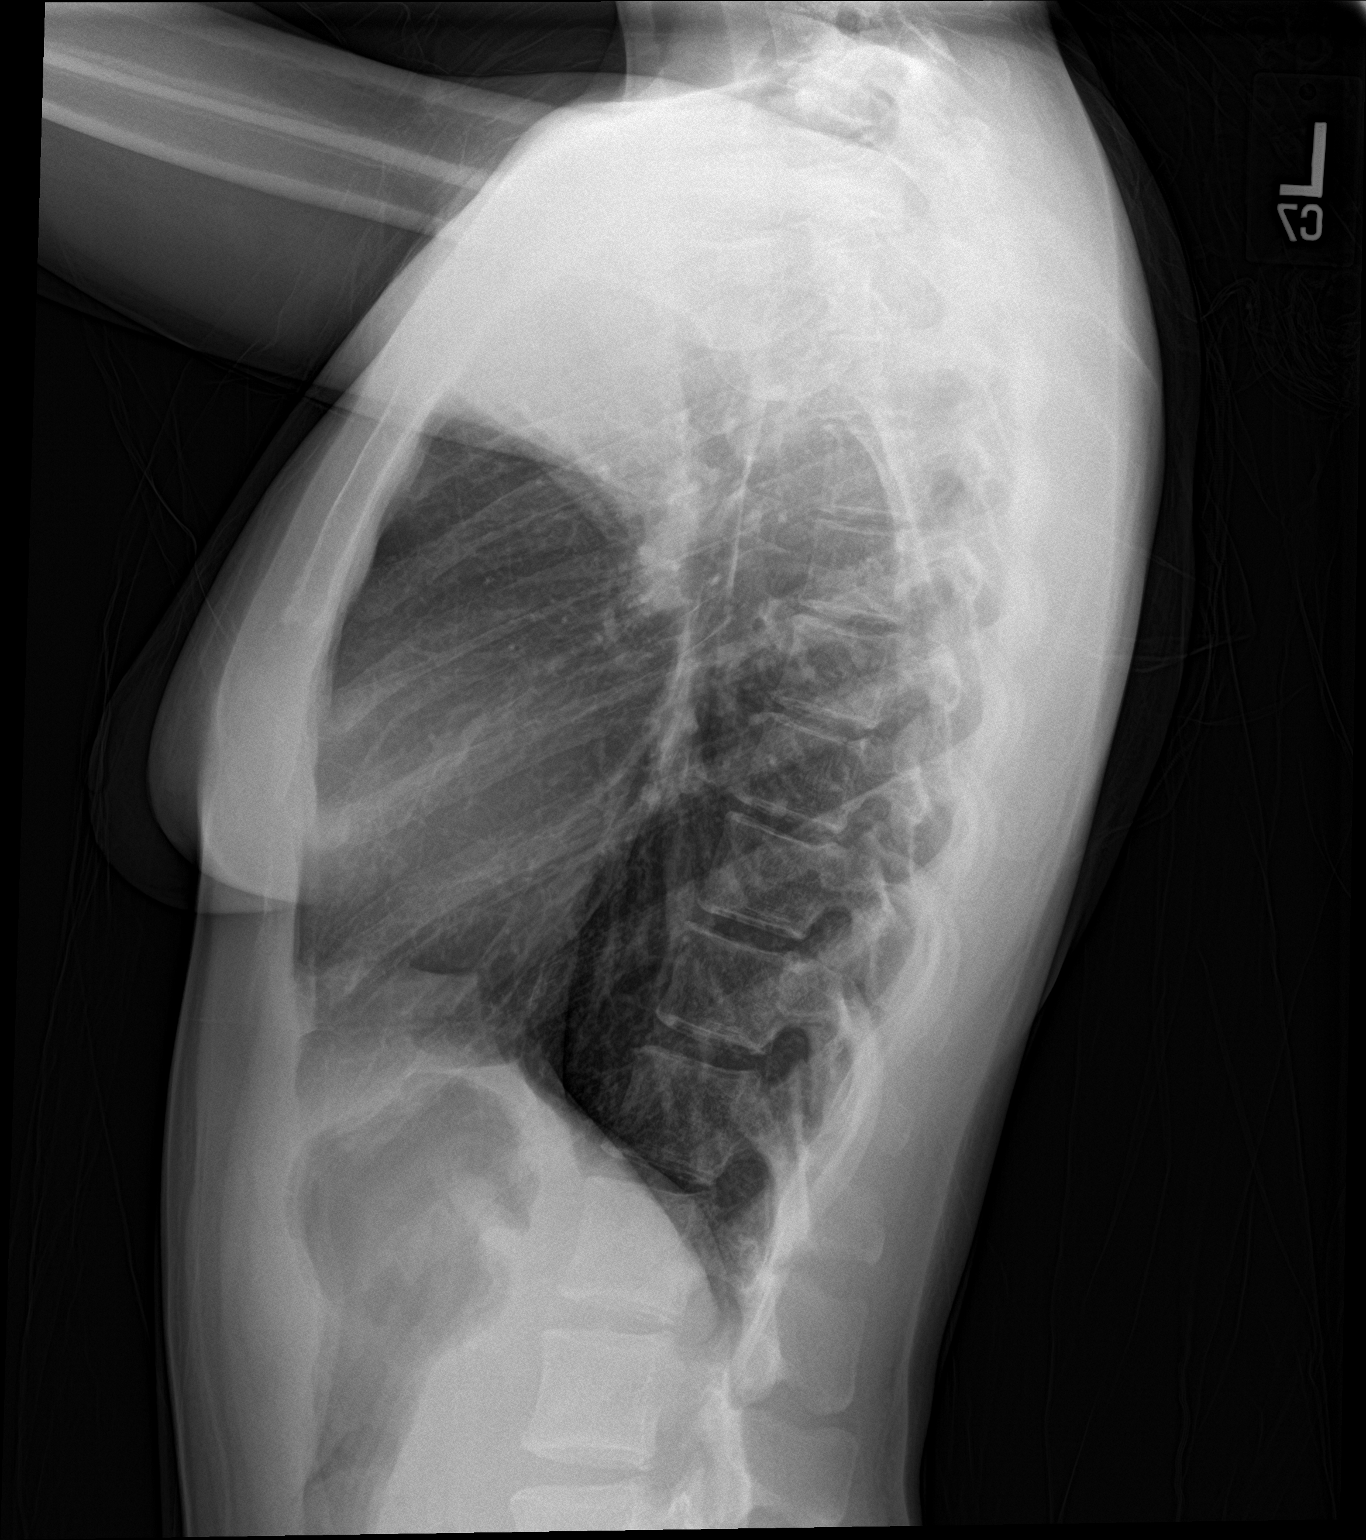

[2 of 2 positions shown; findings below may reference images not displayed]

FINDINGS: There are areas of slight scarring bilaterally. No evident edema or
consolidation. Heart size and pulmonary vascularity are normal. No
adenopathy. No bone lesions.
IMPRESSION: Areas of slight scarring bilaterally. No edema or consolidation.
Heart size normal. No evident adenopathy.
